# Patient Record
Sex: Female | Born: 1955 | Race: Black or African American | Hispanic: No | Marital: Married | Smoking: Never smoker
Health system: Southern US, Community
[De-identification: ages and names within clinical notes are randomized; demographics above are authoritative.]

## PROBLEM LIST (undated history)

## (undated) DIAGNOSIS — M1990 Unspecified osteoarthritis, unspecified site: Secondary | ICD-10-CM

## (undated) DIAGNOSIS — A159 Respiratory tuberculosis unspecified: Secondary | ICD-10-CM

## (undated) DIAGNOSIS — I10 Essential (primary) hypertension: Secondary | ICD-10-CM

## (undated) DIAGNOSIS — J189 Pneumonia, unspecified organism: Secondary | ICD-10-CM

## (undated) HISTORY — DX: Respiratory tuberculosis unspecified: A15.9

## (undated) HISTORY — DX: Unspecified osteoarthritis, unspecified site: M19.90

---
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
## 2006-03-01 HISTORY — PX: ABDOMINAL HYSTERECTOMY: SHX81

## 2011-07-09 ENCOUNTER — Ambulatory Visit
Admission: RE | Admit: 2011-07-09 | Discharge: 2011-07-09 | Disposition: A | Discharge status: Home / Self Care | Payer: Federal, State, Local not specified - PPO | Source: Ambulatory Visit | Attending: Family Medicine | Admitting: Family Medicine

## 2011-07-09 ENCOUNTER — Other Ambulatory Visit: Payer: Self-pay | Admitting: Family Medicine

## 2011-07-09 DIAGNOSIS — R05 Cough: Secondary | ICD-10-CM

## 2011-07-09 DIAGNOSIS — R6883 Chills (without fever): Secondary | ICD-10-CM

## 2011-08-28 ENCOUNTER — Encounter (HOSPITAL_COMMUNITY): Payer: Self-pay | Admitting: *Deleted

## 2011-08-28 ENCOUNTER — Emergency Department (HOSPITAL_COMMUNITY)
Admission: EM | Admit: 2011-08-28 | Discharge: 2011-08-28 | Discharge status: Against Medical Advice | Payer: Federal, State, Local not specified - PPO | Attending: Emergency Medicine | Admitting: Emergency Medicine

## 2011-08-28 DIAGNOSIS — R1110 Vomiting, unspecified: Secondary | ICD-10-CM | POA: Insufficient documentation

## 2011-08-28 DIAGNOSIS — Z0389 Encounter for observation for other suspected diseases and conditions ruled out: Secondary | ICD-10-CM | POA: Insufficient documentation

## 2011-08-28 HISTORY — DX: Essential (primary) hypertension: I10

## 2011-08-28 NOTE — ED Notes (Signed)
Here for feeling bad. Feel better after vomiting upon arrival. Reports "ate a really greasy steak earlier & felt indigestion and belching". Relates sx to acid reflux. "Then went to bed, woke up feeling bad". (denies: pain, sob, diarrhea, cough, congestion, cold sx, fever or dizziness), "feel better now".

## 2011-09-08 ENCOUNTER — Encounter (HOSPITAL_COMMUNITY): Payer: Self-pay | Admitting: *Deleted

## 2011-09-08 ENCOUNTER — Emergency Department (HOSPITAL_COMMUNITY)
Admission: EM | Admit: 2011-09-08 | Discharge: 2011-09-08 | Disposition: A | Discharge status: Home / Self Care | Payer: Federal, State, Local not specified - PPO | Attending: Emergency Medicine | Admitting: Emergency Medicine

## 2011-09-08 ENCOUNTER — Emergency Department (HOSPITAL_COMMUNITY): Payer: Federal, State, Local not specified - PPO

## 2011-09-08 DIAGNOSIS — I10 Essential (primary) hypertension: Secondary | ICD-10-CM | POA: Insufficient documentation

## 2011-09-08 DIAGNOSIS — Z79899 Other long term (current) drug therapy: Secondary | ICD-10-CM | POA: Insufficient documentation

## 2011-09-08 DIAGNOSIS — R609 Edema, unspecified: Secondary | ICD-10-CM | POA: Insufficient documentation

## 2011-09-08 DIAGNOSIS — B9789 Other viral agents as the cause of diseases classified elsewhere: Secondary | ICD-10-CM | POA: Insufficient documentation

## 2011-09-08 DIAGNOSIS — R059 Cough, unspecified: Secondary | ICD-10-CM | POA: Insufficient documentation

## 2011-09-08 DIAGNOSIS — R0602 Shortness of breath: Secondary | ICD-10-CM | POA: Insufficient documentation

## 2011-09-08 DIAGNOSIS — R05 Cough: Secondary | ICD-10-CM | POA: Insufficient documentation

## 2011-09-08 DIAGNOSIS — B349 Viral infection, unspecified: Secondary | ICD-10-CM

## 2011-09-08 HISTORY — DX: Pneumonia, unspecified organism: J18.9

## 2011-09-08 LAB — BASIC METABOLIC PANEL
Chloride: 100 mEq/L (ref 96–112)
GFR calc Af Amer: 85 mL/min — ABNORMAL LOW (ref >90)
Potassium: 3.3 mEq/L — ABNORMAL LOW (ref 3.5–5.1)

## 2011-09-08 LAB — CBC WITH DIFFERENTIAL/PLATELET
Basophils Absolute: 0.1 10*3/uL (ref 0.0–0.1)
Basophils Relative: 1 % (ref 0–1)
Hemoglobin: 13.4 g/dL (ref 12.0–15.0)
Lymphocytes Relative: 31 % (ref 12–46)
MCHC: 33.3 g/dL (ref 30.0–36.0)
Monocytes Relative: 10 % (ref 3–12)
Neutro Abs: 6.2 10*3/uL (ref 1.7–7.7)
Neutrophils Relative %: 58 % (ref 43–77)
RDW: 12.9 % (ref 11.5–15.5)
WBC: 10.7 10*3/uL — ABNORMAL HIGH (ref 4.0–10.5)

## 2011-09-08 LAB — URINE MICROSCOPIC-ADD ON

## 2011-09-08 LAB — URINALYSIS, ROUTINE W REFLEX MICROSCOPIC
Nitrite: NEGATIVE
Specific Gravity, Urine: 1.004 — ABNORMAL LOW (ref 1.005–1.030)
pH: 7 (ref 5.0–8.0)

## 2011-09-08 LAB — RAPID STREP SCREEN (MED CTR MEBANE ONLY): Streptococcus, Group A Screen (Direct): NEGATIVE

## 2011-09-08 MED ORDER — DEXAMETHASONE 6 MG PO TABS
12.0000 mg | ORAL_TABLET | Freq: Once | ORAL | Status: AC
  Administered 2011-09-08: 12 mg via ORAL
  Filled 2011-09-08: qty 2

## 2011-09-08 NOTE — ED Notes (Signed)
Pt reports having pneumonia one year ago, having the same symptoms again. Reports fevers, chills, productive cough and sob. No resp distress noted at triage, spo2 100%

## 2011-09-08 NOTE — ED Provider Notes (Signed)
History     CSN: [id number]  Arrival date & time 09/08/11  1345   First MD Initiated Contact with Patient 09/08/11 2052      Chief Complaint  Patient presents with  . Shortness of Breath  . Cough    (Consider location/radiation/quality/duration/timing/severity/associated sxs/prior treatment) Patient is a 56 y.o. female presenting with shortness of breath and cough. The history is provided by the patient.  Shortness of Breath  Associated symptoms include cough and shortness of breath.  Cough Associated symptoms include shortness of breath.  She has been having some soreness in the lateral chest bilaterally for about the last week. She has had some subjective low-grade fever and some chills but no sweats. She coughs in the morning and raises a small amount of sputum and has no further coughing today and she has no nocturnal cough. She denies any dyspnea, nausea, vomiting. Similar symptoms one year ago were due to pneumonia so she came into the emergency department today evaluated for same. She has not done anything for treatment at home other than over-the-counter cough medication. She has had a mild sore throat and some slight dysuria. She saw her PCP who noted a cerumen impaction which was relieved. She had some dizziness prior to that but feels better regarding the dizziness following the removal of the cerumen impaction.  Past Medical History  Diagnosis Date  . Hypertension   . Pneumonia     Past Surgical History  Procedure Date  . Abdominal hysterectomy   . Knee surgery     Right    History reviewed. No pertinent family history.  History  Substance Use Topics  . Smoking status: Never Smoker   . Smokeless tobacco: Not on file  . Alcohol Use: No    OB History    Grav Para Term Preterm Abortions TAB SAB Ect Mult Living                  Review of Systems  Respiratory: Positive for cough and shortness of breath.   All other systems reviewed and are  negative.    Allergies  Aspirin and Penicillins  Home Medications   Current Outpatient Rx  Name Route Sig Dispense Refill  . AZELASTINE HCL 137 MCG/SPRAY NA SOLN Nasal Place 1 spray into the nose 2 (two) times daily. Use in each nostril as directed    . CETIRIZINE HCL 10 MG PO TABS Oral Take 10 mg by mouth daily.    Marland Kitchen VITAMIN D 1000 UNITS PO TABS Oral Take 1,000 Units by mouth 4 (four) times daily.    Marland Kitchen HYDROCHLOROTHIAZIDE 12.5 MG PO CAPS Oral Take 12.5 mg by mouth daily.    Marland Kitchen OLMESARTAN MEDOXOMIL 20 MG PO TABS Oral Take by mouth daily.     . OMEGA-3-ACID ETHYL ESTERS 1 G PO CAPS Oral Take 2 g by mouth 2 (two) times daily.    Marland Kitchen RANITIDINE HCL 150 MG PO TABS Oral Take 150 mg by mouth 2 (two) times daily.      BP 141/81  Pulse 55  Temp 98.2 F (36.8 C) (Oral)  Resp 18  SpO2 100%  Physical Exam  Nursing note and vitals reviewed.  56 year old female is resting currently in no acute distress. Vital signs are significant for mild hypertension with blood pressure 150 or 70. Oxygen saturation is 100% which is normal. Head is normocephalic and atraumatic. PERRLA, EOMI. Oropharynx is mild tonsillar erythema and hypertrophy without exudate. She's had difficulty with secretions and phonation  is normal. Neck is nontender and supple without adenopathy, JVD, or stridor. Lungs are clear without rales, wheezes, rhonchi. Heart has regular rate rhythm without murmur. There is no chest wall tenderness. Abdomen is soft, flat, nontender without masses or hepatosplenomegaly. Extremities have 2+ edema, no cyanosis. Full range of motion is present. Skin is warm and dry without rash. Neurologic: Mental status is normal, cranial nerves are intact, there are no motor or sensory deficits.  ED Course  Procedures (including critical care time)  Results for orders placed during the hospital encounter of 09/08/11  CBC WITH DIFFERENTIAL      Component Value Range   WBC 10.7 (*) 4.0 - 10.5 K/uL   RBC 4.44  3.87 -  5.11 MIL/uL   Hemoglobin 13.4  12.0 - 15.0 g/dL   HCT 16.1  09.6 - 04.5 %   MCV 90.5  78.0 - 100.0 fL   MCH 30.2  26.0 - 34.0 pg   MCHC 33.3  30.0 - 36.0 g/dL   RDW 40.9  81.1 - 91.4 %   Platelets 292  150 - 400 K/uL   Neutrophils Relative 58  43 - 77 %   Neutro Abs 6.2  1.7 - 7.7 K/uL   Lymphocytes Relative 31  12 - 46 %   Lymphs Abs 3.3  0.7 - 4.0 K/uL   Monocytes Relative 10  3 - 12 %   Monocytes Absolute 1.1 (*) 0.1 - 1.0 K/uL   Eosinophils Relative 1  0 - 5 %   Eosinophils Absolute 0.1  0.0 - 0.7 K/uL   Basophils Relative 1  0 - 1 %   Basophils Absolute 0.1  0.0 - 0.1 K/uL  BASIC METABOLIC PANEL      Component Value Range   Sodium 141  135 - 145 mEq/L   Potassium 3.3 (*) 3.5 - 5.1 mEq/L   Chloride 100  96 - 112 mEq/L   CO2 26  19 - 32 mEq/L   Glucose, Bld 88  70 - 99 mg/dL   BUN 10  6 - 23 mg/dL   Creatinine, Ser 7.82  0.50 - 1.10 mg/dL   Calcium 95.6  8.4 - 21.3 mg/dL   GFR calc non Af Amer 73 (*) >90 mL/min   GFR calc Af Amer 85 (*) >90 mL/min  URINALYSIS, ROUTINE W REFLEX MICROSCOPIC      Component Value Range   Color, Urine YELLOW  YELLOW   APPearance CLEAR  CLEAR   Specific Gravity, Urine 1.004 (*) 1.005 - 1.030   pH 7.0  5.0 - 8.0   Glucose, UA NEGATIVE  NEGATIVE mg/dL   Hgb urine dipstick NEGATIVE  NEGATIVE   Bilirubin Urine NEGATIVE  NEGATIVE   Ketones, ur NEGATIVE  NEGATIVE mg/dL   Protein, ur NEGATIVE  NEGATIVE mg/dL   Urobilinogen, UA 0.2  0.0 - 1.0 mg/dL   Nitrite NEGATIVE  NEGATIVE   Leukocytes, UA TRACE (*) NEGATIVE  URINE MICROSCOPIC-ADD ON      Component Value Range   Squamous Epithelial / LPF MANY (*) RARE   WBC, UA 0-2  <3 WBC/hpf   RBC / HPF 0-2  <3 RBC/hpf   Dg Chest 2 View  09/08/2011  *RADIOLOGY REPORT*  Clinical Data: Cough and shortness of breath.  CHEST - 2 VIEW  Comparison: PA and lateral chest 07/09/2011.  Findings: Small calcification projecting over the right mid lung on the PA view is again seen.  Lungs otherwise clear.  No  pneumothorax or effusion.  Heart size normal.  IMPRESSION: No acute disease.  Original Report Authenticated By: Bernadene Bell. D'ALESSIO, M.D.      1. Viral syndrome       MDM  Vague illness with some symptoms suggestive of pharyngitis, some symptoms suggestive of bronchial infection and, and some symptoms suggestive of urinary tract infection. Chest x-ray shows no pneumonia and, WBC is slightly elevated, urinalysis is unremarkable. Beta strep screen will be obtained and she'll be given a single dose of dexamethasone to treat possible viral pharyngitis. She does relate that a family member was tested for strep but actually had impetigo.  Workup has come back completely negative. Urinalysis is normal, rapid strep screen is negative, chest x-ray is negative. She appears to have a viral illness. Because of pharyngitis, and she'll be given a single dose of dexamethasone. She is to followup with her PCP as needed. She's given reassurance regarding no evidence of pneumonia.      Dione Booze, MD 09/08/11 2207

## 2011-09-08 NOTE — ED Notes (Addendum)
Med ordered from pharmacy and pharmacy called.

## 2011-11-24 ENCOUNTER — Other Ambulatory Visit: Payer: Self-pay | Admitting: Internal Medicine

## 2011-11-24 DIAGNOSIS — Z1231 Encounter for screening mammogram for malignant neoplasm of breast: Secondary | ICD-10-CM

## 2011-12-13 ENCOUNTER — Ambulatory Visit
Admission: RE | Admit: 2011-12-13 | Discharge: 2011-12-13 | Disposition: A | Discharge status: Home / Self Care | Payer: Federal, State, Local not specified - PPO | Source: Ambulatory Visit | Attending: Internal Medicine | Admitting: Internal Medicine

## 2011-12-13 DIAGNOSIS — Z1231 Encounter for screening mammogram for malignant neoplasm of breast: Secondary | ICD-10-CM

## 2011-12-14 ENCOUNTER — Ambulatory Visit (INDEPENDENT_AMBULATORY_CARE_PROVIDER_SITE_OTHER): Payer: Federal, State, Local not specified - PPO | Admitting: Obstetrics and Gynecology

## 2011-12-14 ENCOUNTER — Encounter: Payer: Self-pay | Admitting: Obstetrics and Gynecology

## 2011-12-14 VITALS — BP 122/80 | Ht 64.6 in | Wt 262.0 lb

## 2011-12-14 DIAGNOSIS — I10 Essential (primary) hypertension: Secondary | ICD-10-CM | POA: Insufficient documentation

## 2011-12-14 DIAGNOSIS — Z01419 Encounter for gynecological examination (general) (routine) without abnormal findings: Secondary | ICD-10-CM

## 2011-12-14 NOTE — Progress Notes (Signed)
The patient is not taking hormone replacement therapy The patient  is taking a Calcium supplement. Post-menopausal bleeding:no  Last Pap: was normal 2011 Last mammogram: pt had app yesterday  October  2013 Last DEXA scan : pt stated could not finish the test.  Last colonoscopy:normal 2008 every 10 years   Urinary symptoms: none Normal bowel movements: Yes Reports abuse at home: No:  Pt stated no issues today / NGYN  Subjective:    Jasmine Harrington is a 56 y.o. female G2P1011 who presents for annual exam. S/P TVH with BSO?  2008 for fibroids/endometriosis. Will request op report The patient has no complaints today.   The following portions of the patient's history were reviewed and updated as appropriate: allergies, current medications, past family history, past medical history, past social history, past surgical history and problem list.  Review of Systems Pertinent items are noted in HPI. Gastrointestinal:No change in bowel habits, no abdominal pain, no rectal bleeding Genitourinary:negative for dysuria, frequency, hematuria, nocturia and urinary incontinence    Objective:     BP 122/80  Ht 5' 4.6" (1.641 m)  Wt 262 lb (118.842 kg)  BMI 44.14 kg/m2  Weight:  Wt Readings from Last 1 Encounters:  12/14/11 262 lb (118.842 kg)     BMI: Body mass index is 44.14 kg/(m^2). General Appearance: Alert, appropriate appearance for age. No acute distress HEENT: Grossly normal Neck / Thyroid: Supple, no masses, nodes or enlargement Lungs: clear to auscultation bilaterally Back: No CVA tenderness Breast Exam: No masses or nodes.No dimpling, nipple retraction or discharge. Cardiovascular: Regular rate and rhythm. S1, S2, no murmur Gastrointestinal: Soft, non-tender, no masses or organomegaly Pelvic Exam: normal with no uterus or cervix Rectovaginal: normal rectal, no masses Lymphatic Exam: Non-palpable nodes in neck, clavicular, axillary, or inguinal regions Skin: no rash or  abnormalities Neurologic: Normal gait and speech, no tremor  Psychiatric: Alert and oriented, appropriate affect.    Urinalysis:Not done    Assessment:    Normal gyn exam    Plan:   mammogram pap smear return annually or prn Follow-up:  for annual exam   Silverio Lay MD

## 2012-03-26 ENCOUNTER — Emergency Department (HOSPITAL_COMMUNITY)
Admission: EM | Admit: 2012-03-26 | Discharge: 2012-03-26 | Disposition: A | Discharge status: Home / Self Care | Payer: Federal, State, Local not specified - PPO | Source: Home / Self Care | Attending: Family Medicine | Admitting: Family Medicine

## 2012-03-26 ENCOUNTER — Encounter (HOSPITAL_COMMUNITY): Payer: Self-pay | Admitting: *Deleted

## 2012-03-26 DIAGNOSIS — J069 Acute upper respiratory infection, unspecified: Secondary | ICD-10-CM

## 2012-03-26 MED ORDER — IPRATROPIUM BROMIDE 0.06 % NA SOLN: 2.0000 | Freq: Four times a day (QID) | NASAL | Status: DC

## 2012-03-26 NOTE — ED Provider Notes (Signed)
History     CSN: [id number]  Arrival date & time 03/26/12  1314   First MD Initiated Contact with Patient 03/26/12 1329      Chief Complaint  Patient presents with  . URI    (Consider location/radiation/quality/duration/timing/severity/associated sxs/prior treatment) Patient is a 57 y.o. female presenting with URI. The history is provided by the patient.  URI The primary symptoms include cough. Primary symptoms do not include fever, sore throat, swollen glands, wheezing or abdominal pain. The current episode started 2 days ago. This is a new problem. The problem has not changed since onset. The onset of the illness is associated with exposure to sick contacts. Symptoms associated with the illness include congestion and rhinorrhea. The illness is not associated with chills or sinus pressure.    Past Medical History  Diagnosis Date  . Hypertension   . Pneumonia   . Arthritis   . Tuberculosis     pt was pos for tb . had to take 6 months of meds .     Past Surgical History  Procedure Date  . Abdominal hysterectomy   . Knee surgery     Right    Family History  Problem Relation Age of Onset  . Hypertension Paternal Grandfather   . Hypertension Paternal Grandmother   . Hypertension Maternal Grandmother   . Hypertension Maternal Grandfather   . Prostate cancer Father   . Breast cancer Mother   . Leukemia Brother     History  Substance Use Topics  . Smoking status: Never Smoker   . Smokeless tobacco: Never Used  . Alcohol Use: No    OB History    Grav Para Term Preterm Abortions TAB SAB Ect Mult Living   2 1 1  1  1   1       Review of Systems  Constitutional: Negative for fever and chills.  HENT: Positive for congestion and rhinorrhea. Negative for sore throat and sinus pressure.   Respiratory: Positive for cough. Negative for shortness of breath and wheezing.   Gastrointestinal: Negative.  Negative for abdominal pain.    Allergies  Aspirin and  Penicillins  Home Medications   Current Outpatient Rx  Name  Route  Sig  Dispense  Refill  . AZELASTINE HCL 137 MCG/SPRAY NA SOLN   Nasal   Place 1 spray into the nose 2 (two) times daily. Use in each nostril as directed         . CETIRIZINE HCL 10 MG PO TABS   Oral   Take 10 mg by mouth daily.         Marland Kitchen VITAMIN D 1000 UNITS PO TABS   Oral   Take 1,000 Units by mouth 4 (four) times daily.         Marland Kitchen HYDROCHLOROTHIAZIDE 12.5 MG PO CAPS   Oral   Take 12.5 mg by mouth daily.         . IPRATROPIUM BROMIDE 0.06 % NA SOLN   Nasal   Place 2 sprays into the nose 4 (four) times daily.   15 mL   1   . LORATADINE 10 MG PO TABS   Oral   Take 10 mg by mouth daily.         Marland Kitchen OLMESARTAN MEDOXOMIL 20 MG PO TABS   Oral   Take by mouth daily.          . OMEGA-3-ACID ETHYL ESTERS 1 G PO CAPS   Oral   Take 2 g by  mouth 2 (two) times daily.         Marland Kitchen RANITIDINE HCL 150 MG PO TABS   Oral   Take 150 mg by mouth 2 (two) times daily.           BP 140/76  Pulse 63  Temp 98.2 F (36.8 C) (Oral)  Resp 17  SpO2 100%  Physical Exam  Nursing note and vitals reviewed. Constitutional: She is oriented to person, place, and time. She appears well-developed and well-nourished.  HENT:  Head: Normocephalic.  Right Ear: External ear normal.  Left Ear: External ear normal.  Mouth/Throat: Oropharynx is clear and moist.  Eyes: Pupils are equal, round, and reactive to light.  Neck: Normal range of motion. Neck supple.  Cardiovascular: Normal rate, regular rhythm, normal heart sounds and intact distal pulses.   Pulmonary/Chest: Effort normal and breath sounds normal.  Lymphadenopathy:    She has no cervical adenopathy.  Neurological: She is alert and oriented to person, place, and time.  Skin: Skin is warm and dry.    ED Course  Procedures (including critical care time)  Labs Reviewed - No data to display No results found.   1. URI (upper respiratory infection)        MDM          Linna Hoff, MD 03/27/12 [phone]

## 2012-03-26 NOTE — ED Notes (Signed)
Patient complains of sinus congestion, drainage, and cough x 3 days; yellow phlegm. Denies nausea, vomiting, diarrhea.

## 2012-11-15 ENCOUNTER — Other Ambulatory Visit: Payer: Self-pay

## 2012-11-15 DIAGNOSIS — Z1231 Encounter for screening mammogram for malignant neoplasm of breast: Secondary | ICD-10-CM

## 2012-12-19 ENCOUNTER — Ambulatory Visit
Admission: RE | Admit: 2012-12-19 | Discharge: 2012-12-19 | Disposition: A | Discharge status: Home / Self Care | Payer: Federal, State, Local not specified - PPO | Source: Ambulatory Visit

## 2012-12-19 DIAGNOSIS — Z1231 Encounter for screening mammogram for malignant neoplasm of breast: Secondary | ICD-10-CM

## 2012-12-25 ENCOUNTER — Other Ambulatory Visit: Payer: Self-pay | Admitting: Internal Medicine

## 2012-12-25 DIAGNOSIS — R928 Other abnormal and inconclusive findings on diagnostic imaging of breast: Secondary | ICD-10-CM

## 2013-01-11 ENCOUNTER — Ambulatory Visit
Admission: RE | Admit: 2013-01-11 | Discharge: 2013-01-11 | Disposition: A | Discharge status: Home / Self Care | Source: Ambulatory Visit | Attending: Internal Medicine | Admitting: Internal Medicine

## 2013-01-11 ENCOUNTER — Ambulatory Visit
Admission: RE | Admit: 2013-01-11 | Discharge: 2013-01-11 | Disposition: A | Discharge status: Home / Self Care | Payer: Federal, State, Local not specified - PPO | Source: Ambulatory Visit | Attending: Internal Medicine | Admitting: Internal Medicine

## 2013-01-11 DIAGNOSIS — R928 Other abnormal and inconclusive findings on diagnostic imaging of breast: Secondary | ICD-10-CM

## 2013-06-14 IMAGING — CR DG CHEST 2V
2 series · 2 of 2 positions shown · non-contrast
Comparison: PA and lateral chest 07/09/2011.

CLINICAL DATA: Cough and shortness of breath.

CHEST - 2 VIEW

[w chest pa]
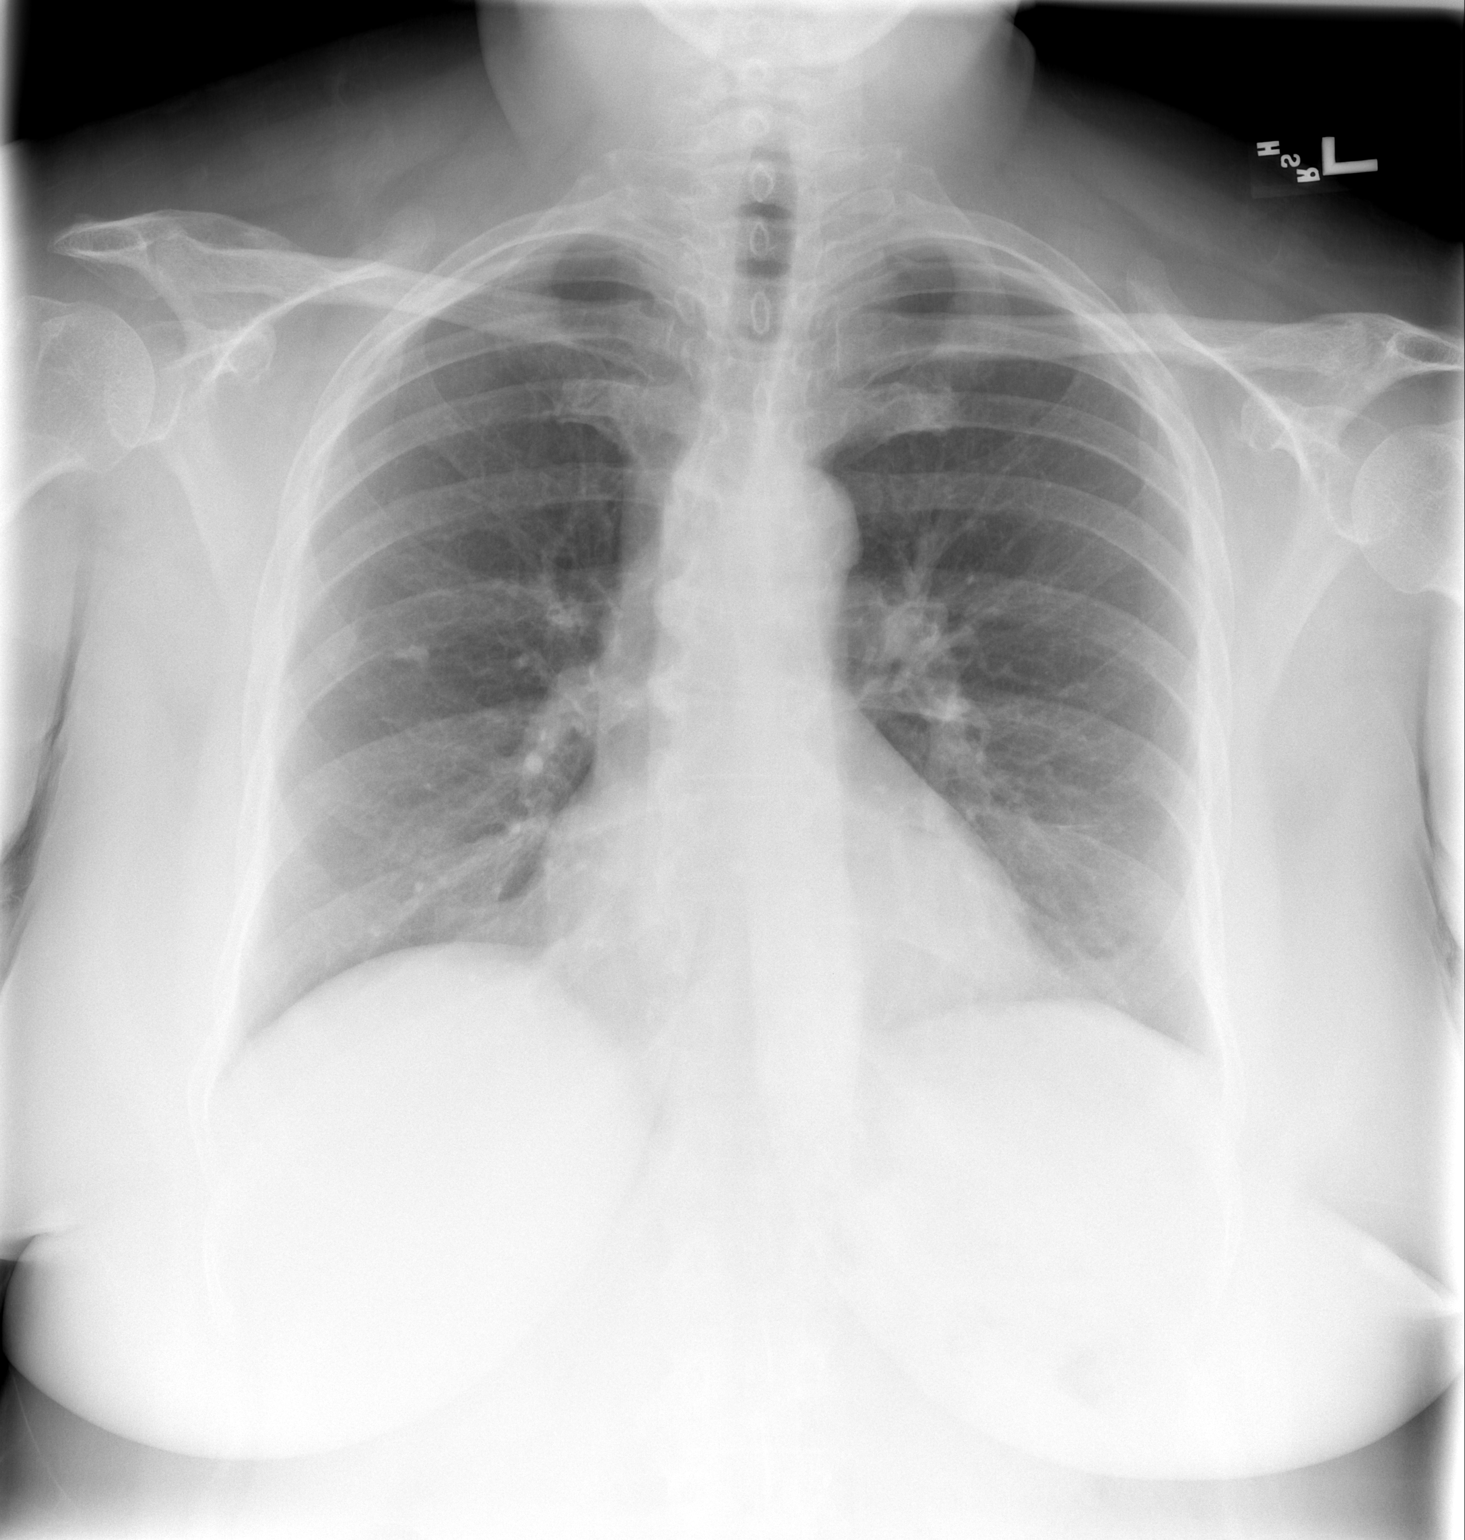

[w chest lat]
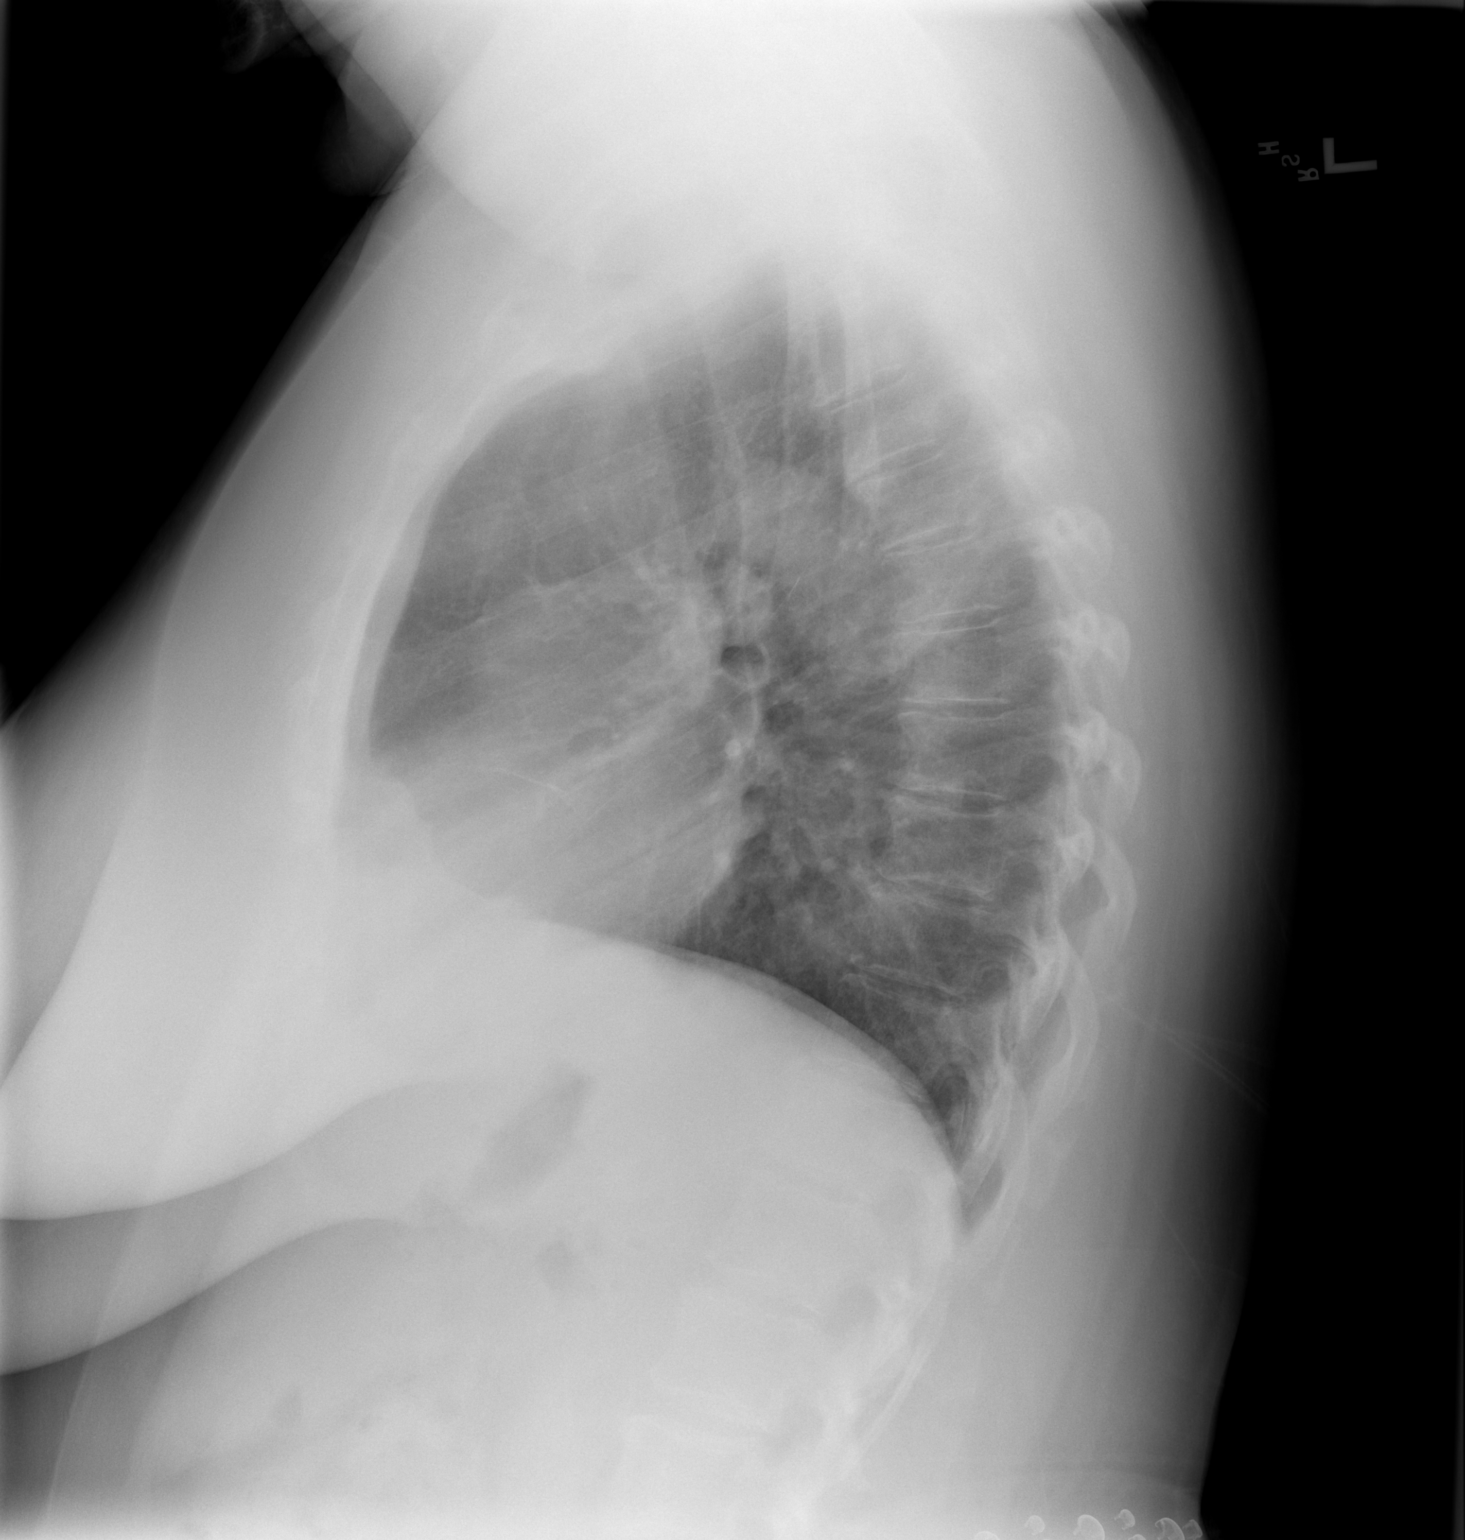

[2 of 2 positions shown; findings below may reference images not displayed]

FINDINGS: Small calcification projecting over the right mid lung on
the PA view is again seen.  Lungs otherwise clear.  No pneumothorax
or effusion.  Heart size normal.
IMPRESSION: No acute disease.

## 2013-06-24 ENCOUNTER — Emergency Department (INDEPENDENT_AMBULATORY_CARE_PROVIDER_SITE_OTHER)
Admission: EM | Admit: 2013-06-24 | Discharge: 2013-06-24 | Disposition: A | Discharge status: Home / Self Care | Payer: Federal, State, Local not specified - PPO | Source: Home / Self Care | Attending: Family Medicine | Admitting: Family Medicine

## 2013-06-24 ENCOUNTER — Emergency Department (INDEPENDENT_AMBULATORY_CARE_PROVIDER_SITE_OTHER): Payer: Federal, State, Local not specified - PPO

## 2013-06-24 ENCOUNTER — Encounter (HOSPITAL_COMMUNITY): Payer: Self-pay | Admitting: Emergency Medicine

## 2013-06-24 DIAGNOSIS — M1710 Unilateral primary osteoarthritis, unspecified knee: Secondary | ICD-10-CM

## 2013-06-24 DIAGNOSIS — IMO0002 Reserved for concepts with insufficient information to code with codable children: Secondary | ICD-10-CM

## 2013-06-24 DIAGNOSIS — M1711 Unilateral primary osteoarthritis, right knee: Secondary | ICD-10-CM

## 2013-06-24 NOTE — ED Provider Notes (Signed)
[name] is a 58 y.o. female who presents to Urgent Care today for right knee pain. Patient slipped and fell onto her right knee 5 days ago. She continues to experience right knee pain. The pain is worse with activity and better with rest. She's tried Tylenol rest and elevation which helps some. She feels well otherwise. She has a history of DJD of the right knee.   Past Medical History  Diagnosis Date  . Hypertension   . Pneumonia   . Arthritis   . Tuberculosis     pt was pos for tb . had to take 6 months of meds .    History  Substance Use Topics  . Smoking status: Never Smoker   . Smokeless tobacco: Never Used  . Alcohol Use: No   ROS as above Medications: No current facility-administered medications for this encounter.   Current Outpatient Prescriptions  Medication Sig Dispense Refill  . azelastine (ASTELIN) 137 MCG/SPRAY nasal spray Place 1 spray into the nose 2 (two) times daily. Use in each nostril as directed      . cetirizine (ZYRTEC) 10 MG tablet Take 10 mg by mouth daily.      . cholecalciferol (VITAMIN D) 1000 UNITS tablet Take 1,000 Units by mouth 4 (four) times daily.      . hydrochlorothiazide (MICROZIDE) 12.5 MG capsule Take 12.5 mg by mouth daily.      Marland Kitchen. ipratropium (ATROVENT) 0.06 % nasal spray Place 2 sprays into the nose 4 (four) times daily.  15 mL  1  . loratadine (CLARITIN) 10 MG tablet Take 10 mg by mouth daily.      Marland Kitchen. olmesartan (BENICAR) 20 MG tablet Take by mouth daily.       Marland Kitchen. omega-3 acid ethyl esters (LOVAZA) 1 G capsule Take 2 g by mouth 2 (two) times daily.      . ranitidine (ZANTAC) 150 MG tablet Take 150 mg by mouth 2 (two) times daily.        Exam:  BP 183/83  Pulse 58  Temp(Src) 98.1 F (36.7 C) (Oral)  Resp 18  SpO2 100% Gen: Well NAD Right knee: Mild effusion. Diffusely tender. Range of motion limited to 10 of extension secondary to pain. Stable ligamentous exam. Capillary refill sensation pulses are intact distally.  No results  found for this or any previous visit (from the past 24 hour(s)). Dg Knee Complete 4 Views Right  06/24/2013   CLINICAL DATA:  Right knee pain following fall.  Remote knee surgery  EXAM: RIGHT KNEE - COMPLETE 4+ VIEW  COMPARISON:  None.  FINDINGS: There is narrowing of the medial compartment with associated osteophytosis. Osteophytosis the lateral compartment additionally. There is no evidence of acute fracture or dislocation. There is a small suprapatellar joint effusion. Small cerclage wire anterior to the tibia.  IMPRESSION: 1. No evidence of fracture. 2. Small effusion. 3. 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   Electronically Signed   By: Jasmine Harrington  Harrington M.D.   On: 06/24/2013 13:13    Assessment and Plan: 58 y.o. female with knee DJD exacerbation due to fall. No evidence of fracture. Patient wishes to continue Tylenol and watchful waiting. She declines further medications. Followup with primary care provider.  Discussed warning signs or symptoms. Please see discharge instructions. Patient expresses understanding.    Jasmine BongEvan S Evangela Heffler, MD 06/24/13 [phone]

## 2013-06-24 NOTE — ED Notes (Signed)
Patient states fell Tuesday while picking up some stuff Injured her right knee Has a pin in the same  Knee since the age of 74

## 2013-06-24 NOTE — Discharge Instructions (Signed)
Thank you for coming in today. Continue Tylenol rest ice and elevation Come back as needed . Knee Pain Knee pain can be a result of an injury or other medical conditions. Treatment will depend on the cause of your pain. HOME CARE  Only take medicine as told by your doctor.  Keep a healthy weight. Being overweight can make the knee hurt more.  Stretch before exercising or playing sports.  If there is constant knee pain, change the way you exercise. Ask your doctor for advice.  Make sure shoes fit well. Choose the right shoe for the sport or activity.  Protect your knees. Wear kneepads if needed.  Rest when you are tired. GET HELP RIGHT AWAY IF:   Your knee pain does not stop.  Your knee pain does not get better.  Your knee joint feels hot to the touch.  You have a fever. MAKE SURE YOU:   Understand these instructions.  Will watch this condition.  Will get help right away if you are not doing well or get worse. Document Released: 05/14/2008 Document Revised: 05/10/2011 Document Reviewed: 05/14/2008 [facility] Patient Information 2014 [place], [place].  Wear and Tear Disorders of the Knee (Arthritis, Osteoarthritis) Everyone will experience wear and tear injuries (arthritis, osteoarthritis) of the knee. These are the changes we all get as we age. They come from the joint stress of daily living. The amount of cartilage damage in your knee and your symptoms determine if you need surgery. Mild problems require approximately two months recovery time. More severe problems take several months to recover. With mild problems, your surgeon may find worn and rough cartilage surfaces. With severe changes, your surgeon may find cartilage that has completely worn away and exposed the bone. Loose bodies of bone and cartilage, bone spurs (excess bone growth), and injuries to the menisci (cushions between the large bones of your leg) are also common. All of these problems can cause pain. For a  mild wear and tear problem, rough cartilage may simply need to be shaved and smoothed. For more severe problems with areas of exposed bone, your surgeon may use an instrument for roughing up the bone surfaces to stimulate new cartilage growth. Loose bodies are usually removed. Torn menisci may be trimmed or repaired. ABOUT THE ARTHROSCOPIC PROCEDURE Arthroscopy is a surgical technique. It allows your orthopedic surgeon to diagnose and treat your knee injury with accuracy. The surgeon looks into your knee through a small scope. The scope is like a small (pencil-sized) telescope. Arthroscopy is less invasive than open knee surgery. You can expect a more rapid recovery. After the procedure, you will be moved to a recovery area until most of the effects of the medication have worn off. Your caregiver will discuss the test results with you. RECOVERY The severity of the arthritis and the type of procedure performed will determine recovery time. Other important factors include age, physical condition, medical conditions, and the type of rehabilitation program. Strengthening your muscles after arthroscopy helps guarantee a better recovery. Follow your caregiver's instructions. Use crutches, rest, elevate, ice, and do knee exercises as instructed. Your caregivers will help you and instruct you with exercises and other physical therapy required to regain your mobility, muscle strength, and functioning following surgery. Only take over-the-counter or prescription medicines for pain, discomfort, or fever as directed by your caregiver.  SEEK MEDICAL CARE IF:   There is increased bleeding (more than a small spot) from the wound.  You notice redness, swelling, or increasing pain in  the wound.  Pus is coming from wound.  You develop an unexplained oral temperature above 102 F (38.9 C) , or as your caregiver suggests.  You notice a foul smell coming from the wound or dressing.  You have severe pain with motion of  the knee. SEEK IMMEDIATE MEDICAL CARE IF:   You develop a rash.  You have difficulty breathing.  You have any allergic problems. MAKE SURE YOU:   Understand these instructions.  Will watch your condition.  Will get help right away if you are not doing well or get worse. Document Released: 02/13/2000 Document Revised: 05/10/2011 Document Reviewed: 07/12/2007 [facility] Patient Information 2014 [place], [place].

## 2013-12-31 ENCOUNTER — Encounter (HOSPITAL_COMMUNITY): Payer: Self-pay | Admitting: Emergency Medicine

## 2014-02-08 ENCOUNTER — Other Ambulatory Visit: Payer: Self-pay

## 2014-02-08 DIAGNOSIS — Z1231 Encounter for screening mammogram for malignant neoplasm of breast: Secondary | ICD-10-CM

## 2014-03-07 ENCOUNTER — Ambulatory Visit
Admission: RE | Admit: 2014-03-07 | Discharge: 2014-03-07 | Disposition: A | Discharge status: Home / Self Care | Payer: Federal, State, Local not specified - PPO | Source: Ambulatory Visit

## 2014-03-07 DIAGNOSIS — Z1231 Encounter for screening mammogram for malignant neoplasm of breast: Secondary | ICD-10-CM

## 2015-03-31 IMAGING — CR DG KNEE COMPLETE 4+V*R*
4 series · 4 of 4 positions shown · non-contrast
Comparison: None.

CLINICAL DATA: Right knee pain following fall.  Remote knee surgery

EXAM:
RIGHT KNEE - COMPLETE 4+ VIEW

[view not recorded (1 of 4)]
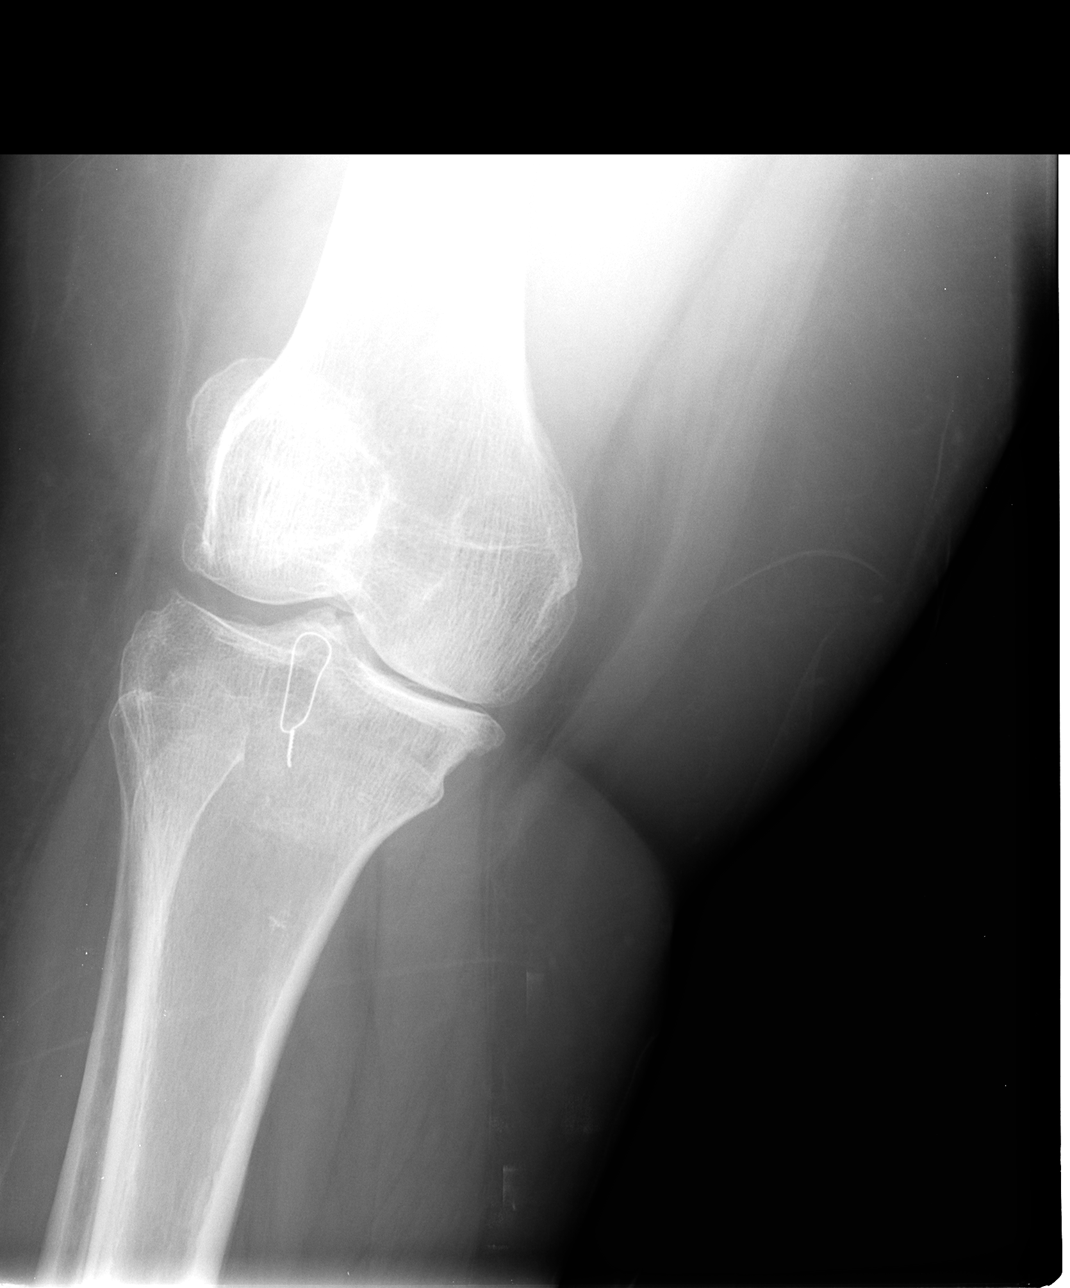

[view not recorded (2 of 4)]
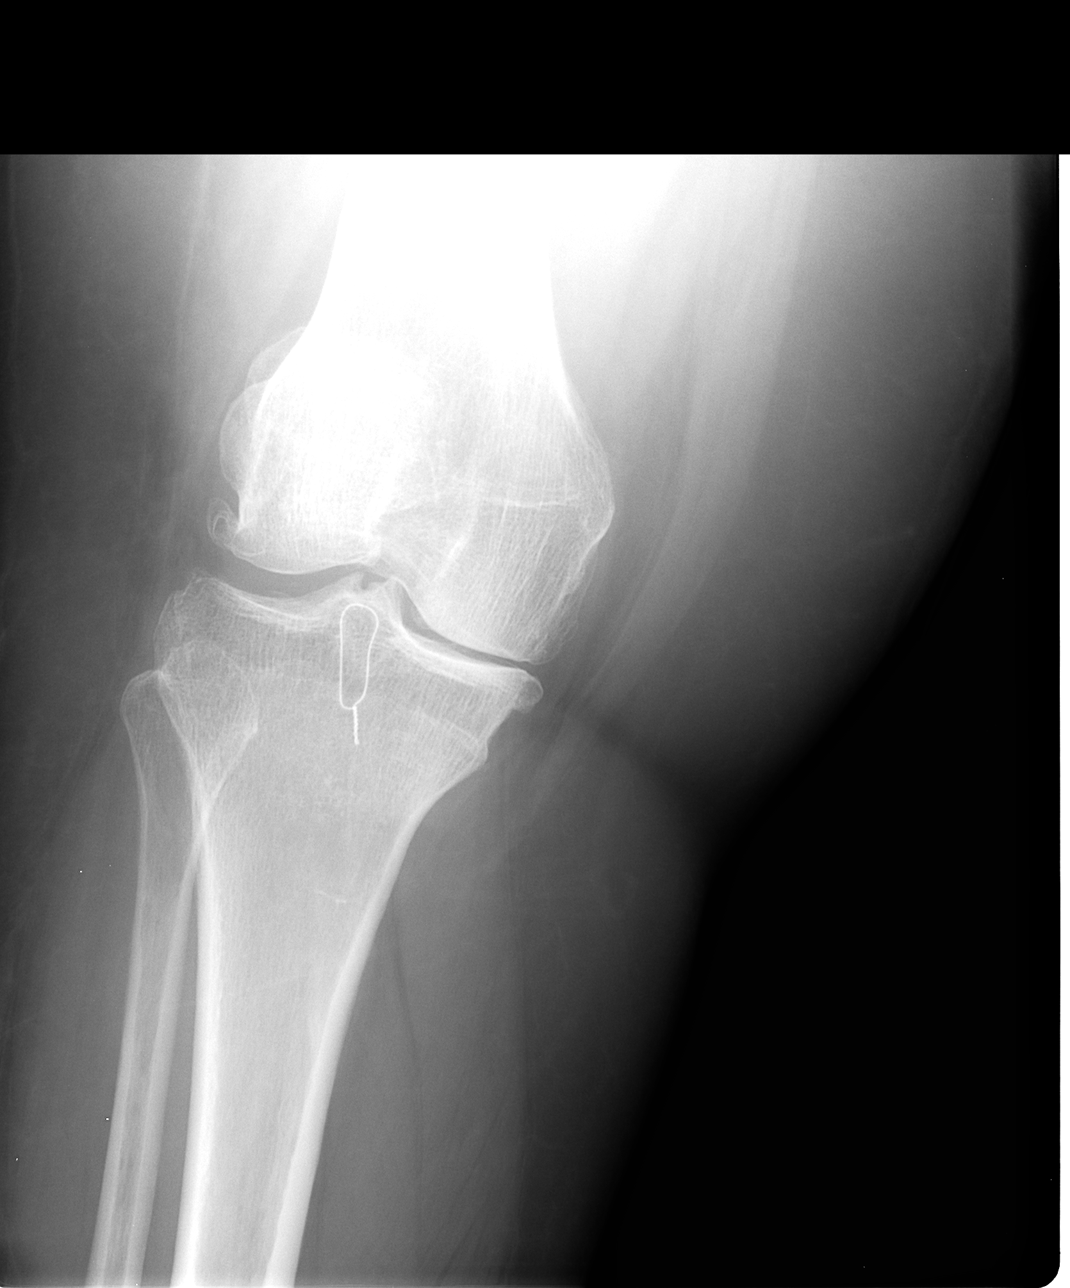

[view not recorded (3 of 4)]
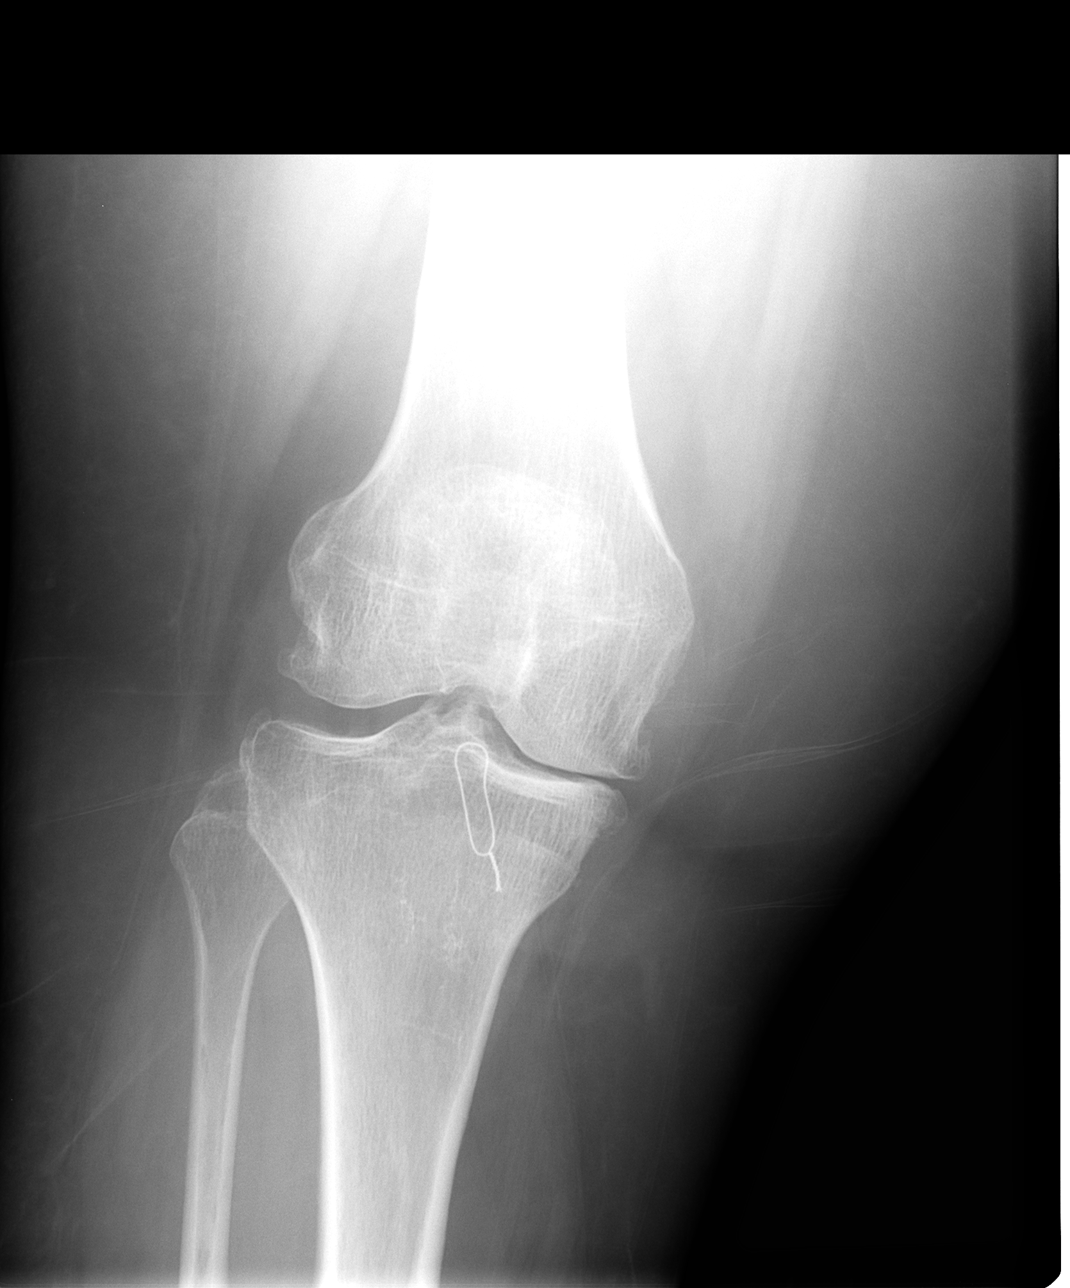

[view not recorded (4 of 4)]
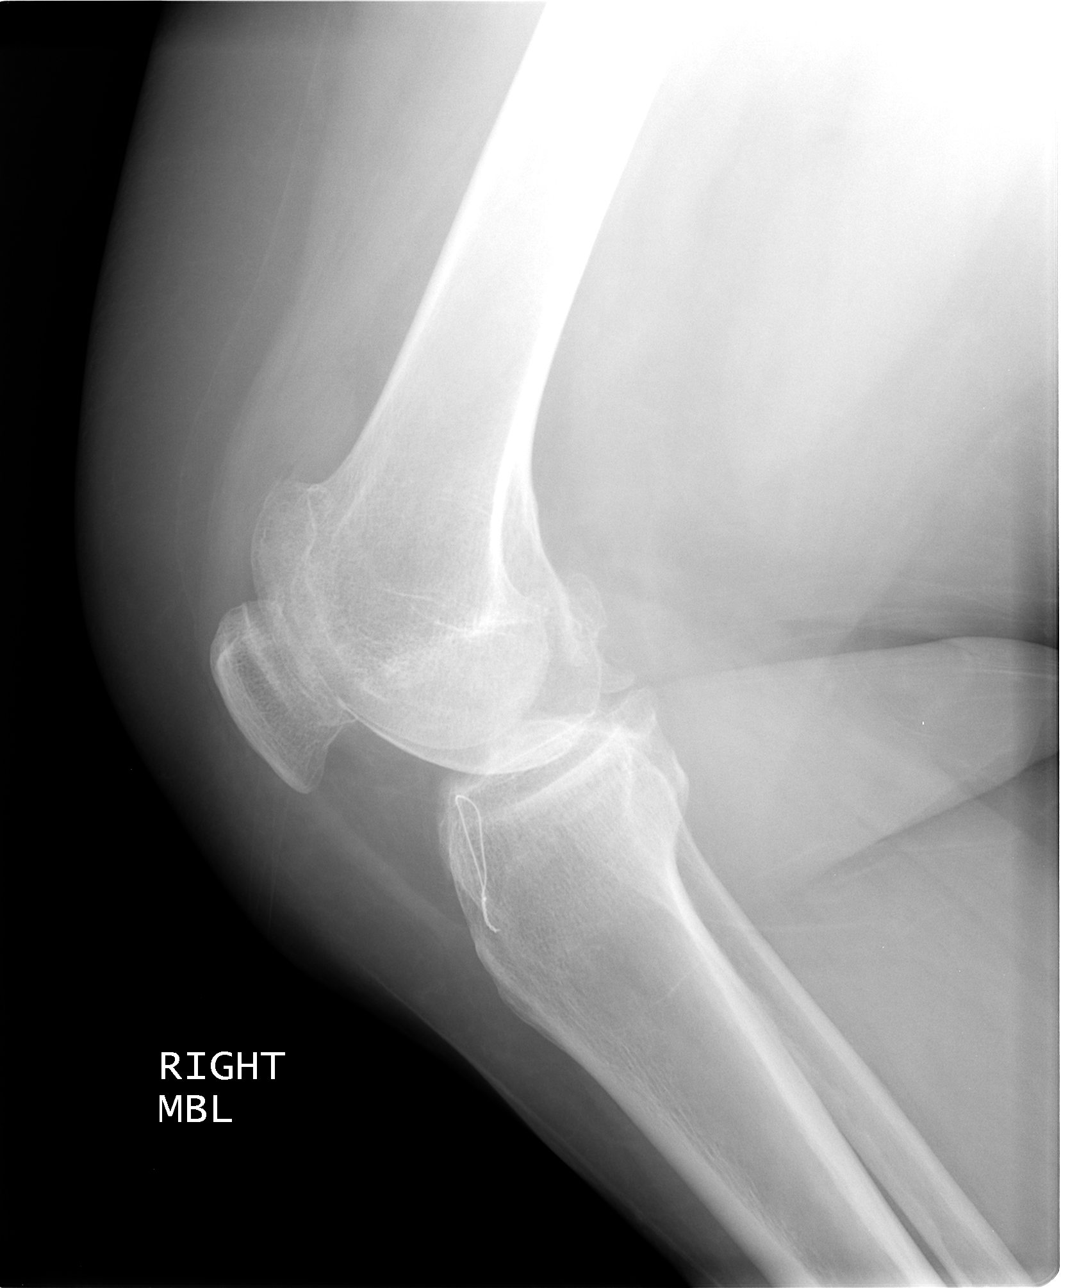

[4 of 4 positions shown; findings below may reference images not displayed]

FINDINGS: There is narrowing of the medial compartment with associated
osteophytosis. Osteophytosis the lateral compartment additionally.
There is no evidence of acute fracture or dislocation. There is a
small suprapatellar joint effusion. Small cerclage wire anterior to
the tibia.
IMPRESSION: 1. No evidence of fracture.
2. Small effusion.
3. Bicompartment osteoarthritis more severe in the medial
compartment.

## 2015-04-21 ENCOUNTER — Other Ambulatory Visit: Payer: Self-pay

## 2015-04-21 DIAGNOSIS — Z1231 Encounter for screening mammogram for malignant neoplasm of breast: Secondary | ICD-10-CM

## 2015-05-02 ENCOUNTER — Ambulatory Visit
Admission: RE | Admit: 2015-05-02 | Discharge: 2015-05-02 | Disposition: A | Discharge status: Home / Self Care | Source: Ambulatory Visit

## 2015-05-02 DIAGNOSIS — Z1231 Encounter for screening mammogram for malignant neoplasm of breast: Secondary | ICD-10-CM

## 2016-04-02 ENCOUNTER — Other Ambulatory Visit: Payer: Self-pay | Admitting: Internal Medicine

## 2016-04-02 DIAGNOSIS — Z1231 Encounter for screening mammogram for malignant neoplasm of breast: Secondary | ICD-10-CM

## 2016-05-07 ENCOUNTER — Ambulatory Visit
Admission: RE | Admit: 2016-05-07 | Discharge: 2016-05-07 | Disposition: A | Discharge status: Home / Self Care | Source: Ambulatory Visit | Attending: Internal Medicine | Admitting: Internal Medicine

## 2016-05-07 DIAGNOSIS — Z1231 Encounter for screening mammogram for malignant neoplasm of breast: Secondary | ICD-10-CM

## 2016-06-14 ENCOUNTER — Encounter: Admitting: Neurology

## 2016-06-14 ENCOUNTER — Encounter

## 2016-06-14 ENCOUNTER — Telehealth: Payer: Self-pay | Admitting: *Deleted

## 2016-06-14 NOTE — Telephone Encounter (Signed)
Called and LVM for pt letting her know her appt is cx due to power outage. Office closed. Advised we will call  Back to r/s her appt.

## 2016-07-07 ENCOUNTER — Encounter: Payer: Self-pay | Admitting: Neurology

## 2016-07-07 ENCOUNTER — Ambulatory Visit (INDEPENDENT_AMBULATORY_CARE_PROVIDER_SITE_OTHER): Admitting: Neurology

## 2016-07-07 ENCOUNTER — Ambulatory Visit (INDEPENDENT_AMBULATORY_CARE_PROVIDER_SITE_OTHER): Payer: Self-pay | Admitting: Neurology

## 2016-07-07 ENCOUNTER — Encounter (INDEPENDENT_AMBULATORY_CARE_PROVIDER_SITE_OTHER): Payer: Self-pay

## 2016-07-07 DIAGNOSIS — R202 Paresthesia of skin: Secondary | ICD-10-CM

## 2016-07-07 DIAGNOSIS — G5621 Lesion of ulnar nerve, right upper limb: Secondary | ICD-10-CM | POA: Diagnosis not present

## 2016-07-07 NOTE — Progress Notes (Signed)
Please refer to EMG and nerve conduction study procedure note. 

## 2016-07-07 NOTE — Procedures (Signed)
     HISTORY:  Jasmine Harrington is a 61 year old patient with a history of numbness in the fourth and fifth fingers of the right hand that has been present for several months. The patient denies any neck pain or pain down the right arm. She is being evaluated for a possible neuropathy or a cervical radiculopathy.  NERVE CONDUCTION STUDIES:  Nerve conduction studies were performed on both upper extremities. The distal motor latency for the right median and ulnar nerves were normal, with normal motor amplitudes for these nerves. The nerve conduction velocities for the right median and ulnar nerves were normal. The study of the left ulnar nerve revealed a normal distal motor latency and motor amplitude and normal nerve conduction velocities. The sensory latency for the right ulnar nerve was unobtainable, the orthodromic right ulnar sensory latency was also unobtainable as was the right dorsal ulnar cutaneous nerve study. The sensory latencies for the left ulnar nerve and for the right median and radial nerves were within normal limits. The left ulnar orthodromic sensory latency was normal. The left dorsal ulnar cutaneous entry latency was normal. The F wave latencies for the right median nerve and left ulnar nerve were normal.    EMG STUDIES:  EMG study was performed on the right upper extremity:  The first dorsal interosseous muscle reveals 2 to 4 K units with full recruitment. No fibrillations or positive waves were noted. The abductor pollicis brevis muscle reveals 2 to 4 K units with full recruitment. No fibrillations or positive waves were noted. The abductor digiti quinti muscle reveals 2 to 3 K units with full recruitment. No fibrillations or positive waves were noted. The extensor indicis proprius muscle reveals 1 to 3 K units with full recruitment. No fibrillations or positive waves were noted. The pronator teres muscle reveals 2 to 3 K units with full recruitment. No fibrillations or positive  waves were noted. The flexor digitorum profundus muscle (III-IV) reveals 2 to 3 K units with full recruitment. No fibrillations or positive waves were noted. The biceps muscle reveals 1 to 2 K units with full recruitment. No fibrillations or positive waves were noted. The triceps muscle reveals 2 to 4 K units with full recruitment. No fibrillations or positive waves were noted. The anterior deltoid muscle reveals 2 to 3 K units with full recruitment. No fibrillations or positive waves were noted. The cervical paraspinal muscles were tested at 2 levels. No abnormalities of insertional activity were seen at either level tested. There was good relaxation.   IMPRESSION:  Nerve conduction studies done on both upper extremities shows evidence of sensory abnormalities for the right ulnar nerve, otherwise the motor component to the right ulnar nerve was unaffected, no evidence of carpal tunnel syndrome was seen on the right. The left ulnar nerve function was normal. EMG evaluation of the right upper extremity shows no evidence of an overlying cervical radiculopathy. No denervation was seen within the right ulnar nerve distribution.  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  Marlan Palau. Keith Kelia Gibbon MD 07/07/2016 3:06 PM  [facility] [address] [place], [place] [id number]  Phone [phone] Fax [phone]

## 2017-06-22 DIAGNOSIS — Z0000 Encounter for general adult medical examination without abnormal findings: Secondary | ICD-10-CM | POA: Insufficient documentation

## 2017-06-22 LAB — HEPATIC FUNCTION PANEL
ALT: 18 (ref 7–35)
AST: 22 (ref 13–35)
Alkaline Phosphatase: 83 (ref 25–125)
BILIRUBIN, TOTAL: 0.3

## 2017-06-22 LAB — LIPID PANEL
Cholesterol: 192 (ref 0–200)
HDL: 48 (ref 35–70)
LDL CALC: 125
LDL/HDL RATIO: 2.6
Triglycerides: 97 (ref 40–160)

## 2017-06-22 LAB — BASIC METABOLIC PANEL
BUN: 11 (ref 4–21)
CREATININE: 0.9 (ref 0.5–1.1)
GLUCOSE: 108
POTASSIUM: 3.8 (ref 3.4–5.3)
SODIUM: 142 (ref 137–147)

## 2017-06-22 LAB — CBC AND DIFFERENTIAL
HCT: 43 (ref 36–46)
Hemoglobin: 14 (ref 12.0–16.0)
Platelets: 255 (ref 150–399)
WBC: 6.7

## 2017-06-22 LAB — VITAMIN D 25 HYDROXY (VIT D DEFICIENCY, FRACTURES): Vit D, 25-Hydroxy: 36.4

## 2017-06-22 LAB — HEMOGLOBIN A1C: Hemoglobin A1C: 5.6

## 2017-10-03 LAB — HM MAMMOGRAPHY: HM Mammogram: NORMAL (ref 0–4)

## 2017-10-03 LAB — HM PAP SMEAR: HM Pap smear: NEGATIVE

## 2017-10-03 LAB — HM DEXA SCAN: HM Dexa Scan: NORMAL

## 2017-10-30 HISTORY — PX: COLONOSCOPY: SHX174

## 2017-11-11 LAB — HM COLONOSCOPY

## 2017-12-11 ENCOUNTER — Encounter: Payer: Self-pay | Admitting: Internal Medicine

## 2017-12-11 DIAGNOSIS — Z0000 Encounter for general adult medical examination without abnormal findings: Secondary | ICD-10-CM

## 2017-12-19 ENCOUNTER — Other Ambulatory Visit: Payer: Self-pay

## 2017-12-19 ENCOUNTER — Encounter: Payer: Self-pay | Admitting: Internal Medicine

## 2017-12-19 ENCOUNTER — Ambulatory Visit (INDEPENDENT_AMBULATORY_CARE_PROVIDER_SITE_OTHER): Admitting: Internal Medicine

## 2017-12-19 VITALS — BP 122/84 | HR 64 | Temp 98.2°F | Ht 61.5 in | Wt 243.4 lb

## 2017-12-19 DIAGNOSIS — J301 Allergic rhinitis due to pollen: Secondary | ICD-10-CM

## 2017-12-19 DIAGNOSIS — I10 Essential (primary) hypertension: Secondary | ICD-10-CM

## 2017-12-19 DIAGNOSIS — Z6841 Body Mass Index (BMI) 40.0 and over, adult: Secondary | ICD-10-CM

## 2017-12-19 DIAGNOSIS — R7309 Other abnormal glucose: Secondary | ICD-10-CM | POA: Diagnosis not present

## 2017-12-19 DIAGNOSIS — K219 Gastro-esophageal reflux disease without esophagitis: Secondary | ICD-10-CM

## 2017-12-19 MED ORDER — OLMESARTAN MEDOXOMIL 40 MG PO TABS: 40.0000 mg | ORAL_TABLET | Freq: Every day | ORAL | 1 refills | Status: DC

## 2017-12-19 MED ORDER — AZELASTINE HCL 0.1 % NA SOLN: 1.0000 | Freq: Two times a day (BID) | NASAL | 3 refills | Status: DC

## 2017-12-19 NOTE — Progress Notes (Signed)
Subjective:     Patient ID: Jasmine Harrington , female    DOB: 1956/01/07 , 62 y.o.   MRN: [id number]   Hypertension  This is a chronic problem. The current episode started more than 1 year ago. The problem is unchanged. The problem is controlled. Pertinent negatives include no blurred vision, chest pain, headaches, palpitations or shortness of breath. There are no associated agents to hypertension.  REPORTS COMPLIANCE WITH MEDS.    Past Medical History:  Diagnosis Date  . Arthritis   . Hypertension   . Pneumonia   . Tuberculosis    pt was pos for tb . had to take 6 months of meds .       Current Outpatient Medications:  .  azelastine (ASTELIN) 0.1 % nasal spray, Place 1 spray into both nostrils 2 (two) times daily. Use in each nostril as directed, Disp: 90 mL, Rfl: 3 .  cetirizine (ZYRTEC) 10 MG tablet, Take 10 mg by mouth daily., Disp: , Rfl:  .  cholecalciferol (VITAMIN D) 1000 UNITS tablet, Take 1,000 Units by mouth 4 (four) times daily., Disp: , Rfl:  .  hydrochlorothiazide (MICROZIDE) 12.5 MG capsule, Take 12.5 mg by mouth daily., Disp: , Rfl:  .  loratadine (CLARITIN) 10 MG tablet, Take 10 mg by mouth daily., Disp: , Rfl:  .  olmesartan (BENICAR) 40 MG tablet, Take 1 tablet (40 mg total) by mouth daily., Disp: 90 tablet, Rfl: 1 .  omega-3 acid ethyl esters (LOVAZA) 1 G capsule, Take 2 g by mouth 2 (two) times daily., Disp: , Rfl:  .  ranitidine (ZANTAC) 150 MG tablet, Take 150 mg by mouth 2 (two) times daily., Disp: , Rfl:  .  VITAMIN E PO, Take by mouth., Disp: , Rfl:    Allergies  Allergen Reactions  . Aspirin     rash  . Acetomenaphthone (Menadiol Diacetate) [Menadiol Sodium Diphosphate] Hives and Rash  . Penicillins Rash     Review of Systems  Constitutional: Negative.   HENT: Positive for postnasal drip and rhinorrhea.   Eyes: Negative.  Negative for blurred vision.  Respiratory: Negative.  Negative for shortness of breath.   Cardiovascular: Negative.  Negative for  chest pain and palpitations.  Gastrointestinal: Negative.        She has reflux. Controlled with ZANTAC  Genitourinary: Negative.   Neurological: Negative for headaches.  Psychiatric/Behavioral: Negative.      Today's Vitals   12/19/17 0850  BP: 122/84  Pulse: 64  Temp: 98.2 F (36.8 C)  TempSrc: Oral  Weight: 243 lb 6.4 oz (110.4 kg)  Height: 5' 1.5" (1.562 m)   Body mass index is 45.25 kg/m.   Objective:  Physical Exam  Constitutional: She is oriented to person, place, and time. She appears well-developed and well-nourished.  Eyes: EOM are normal.  Cardiovascular: Normal rate, regular rhythm and normal heart sounds.  Pulmonary/Chest: Effort normal and breath sounds normal.  Neurological: She is alert and oriented to person, place, and time.  Psychiatric: She has a normal mood and affect.  Nursing note and vitals reviewed.       Assessment And Plan:     1. Essential hypertension WELL CONTROLLED. SHE WILL CONTINUE WITH CURRENT MEDS. SHE WILL RTO IN SIX MONTHS FOR HER NEXT PHYSICAL EXAMINATION.   2. GERD without esophagitis CHRONIC. SHE WILL CONTINUE WITH ZANTAC ONCE DAILY.  PT ADVISED I MAY BE ABLE TO WEAN HER OFF COMPLETELY ONCE SHE NEARS HER IDEAL BODY WEIGHT.   3. Seasonal  allergic rhinitis due to pollen CHRONIC. SHE WILL CONTINUE WITH ZYRTEC NIGHTLY.   4. Other abnormal glucose HER A1C HAS BEEN ELEVATED IN THE PAST. I WILL CHECK AN A1C, BMET TODAY. SHE WAS ENCOURAGED TO AVOID SUGARY BEVERAGES AND PROCESSED FOODS INCLUDNG BREADS, RICE AND PASTA.   5. Body mass index (BMI) 45.0-49.9, adult (HCC) SHE IS ENCOURAGED TO STRIVE FOR BMI LESS THAN 40 TO DECREASE CARDIAC RISK.  6. Morbid obesity (HCC) SHE HAS LOST 7 POUNDS SINCE HER LAST VISIT. SHE WAS CONGRATULATED ON HER WEIGHT LOSS EFFORTS AND ENCOURAGED TO KEEP UP THE GREAT WORK.         Gwynneth Aliment, MD

## 2017-12-19 NOTE — Patient Instructions (Signed)
Exercising to Lose Weight Exercising can help you to lose weight. In order to lose weight through exercise, you need to do vigorous-intensity exercise. You can tell that you are exercising with vigorous intensity if you are breathing very hard and fast and cannot hold a conversation while exercising. Moderate-intensity exercise helps to maintain your current weight. You can tell that you are exercising at a moderate level if you have a higher heart rate and faster breathing, but you are still able to hold a conversation. How often should I exercise? Choose an activity that you enjoy and set realistic goals. Your health care provider can help you to make an activity plan that works for you. Exercise regularly as directed by your health care provider. This may include:  Doing resistance training twice each week, such as: ? Push-ups. ? Sit-ups. ? Lifting weights. ? Using resistance bands.  Doing a given intensity of exercise for a given amount of time. Choose from these options: ? 150 minutes of moderate-intensity exercise every week. ? 75 minutes of vigorous-intensity exercise every week. ? A mix of moderate-intensity and vigorous-intensity exercise every week.  Children, pregnant women, people who are out of shape, people who are overweight, and older adults may need to consult a health care provider for individual recommendations. If you have any sort of medical condition, be sure to consult your health care provider before starting a new exercise program. What are some activities that can help me to lose weight?  Walking at a rate of at least 4.5 miles an hour.  Jogging or running at a rate of 5 miles per hour.  Biking at a rate of at least 10 miles per hour.  Lap swimming.  Roller-skating or in-line skating.  Cross-country skiing.  Vigorous competitive sports, such as football, basketball, and soccer.  Jumping rope.  Aerobic dancing. How can I be more active in my day-to-day  activities?  Use the stairs instead of the elevator.  Take a walk during your lunch break.  If you drive, park your car farther away from work or school.  If you take public transportation, get off one stop early and walk the rest of the way.  Make all of your phone calls while standing up and walking around.  Get up, stretch, and walk around every 30 minutes throughout the day. What guidelines should I follow while exercising?  Do not exercise so much that you hurt yourself, feel dizzy, or get very short of breath.  Consult your health care provider prior to starting a new exercise program.  Wear comfortable clothes and shoes with good support.  Drink plenty of water while you exercise to prevent dehydration or heat stroke. Body water is lost during exercise and must be replaced.  Work out until you breathe faster and your heart beats faster. This information is not intended to replace advice given to you by your health care provider. Make sure you discuss any questions you have with your health care provider. Document Released: 03/20/2010 Document Revised: 07/24/2015 Document Reviewed: 07/19/2013 Elsevier Interactive Patient Education  2018 Elsevier Inc.  

## 2017-12-20 LAB — HEMOGLOBIN A1C
Est. average glucose Bld gHb Est-mCnc: 117 mg/dL
Hgb A1c MFr Bld: 5.7 % — ABNORMAL HIGH (ref 4.8–5.6)

## 2017-12-20 LAB — LIPID PANEL
Chol/HDL Ratio: 4.5 ratio — ABNORMAL HIGH (ref 0.0–4.4)
Cholesterol, Total: 189 mg/dL (ref 100–199)
HDL: 42 mg/dL (ref >39)
LDL CALC: 127 mg/dL — AB (ref 0–99)
TRIGLYCERIDES: 101 mg/dL (ref 0–149)
VLDL Cholesterol Cal: 20 mg/dL (ref 5–40)

## 2017-12-20 LAB — CMP14+EGFR
A/G RATIO: 1.3 (ref 1.2–2.2)
ALBUMIN: 4.2 g/dL (ref 3.6–4.8)
ALT: 20 IU/L (ref 0–32)
AST: 33 IU/L (ref 0–40)
Alkaline Phosphatase: 75 IU/L (ref 39–117)
BUN / CREAT RATIO: 19 (ref 12–28)
BUN: 19 mg/dL (ref 8–27)
Bilirubin Total: <0.2 mg/dL (ref 0.0–1.2)
CALCIUM: 9.7 mg/dL (ref 8.7–10.3)
CO2: 15 mmol/L — AB (ref 20–29)
CREATININE: 1.01 mg/dL — AB (ref 0.57–1.00)
Chloride: 106 mmol/L (ref 96–106)
GFR calc Af Amer: 69 mL/min/{1.73_m2} (ref >59)
GFR, EST NON AFRICAN AMERICAN: 60 mL/min/{1.73_m2} (ref >59)
GLOBULIN, TOTAL: 3.3 g/dL (ref 1.5–4.5)
Glucose: 98 mg/dL (ref 65–99)
POTASSIUM: 4.6 mmol/L (ref 3.5–5.2)
SODIUM: 145 mmol/L — AB (ref 134–144)
Total Protein: 7.5 g/dL (ref 6.0–8.5)

## 2017-12-20 NOTE — Progress Notes (Signed)
Your kidney function has decreased. Additionally, your sodium level is elevated. This implies that you are not drinking enough water.  Your liver function is normal. Your LDL, bad cholesterol, is 127. Ideally, this should be less than 100.  Please increase your exercise to four to five days weekly for at least 30 minutes. Your hba1c is 5.7, this is in prediabetes range. Please avoid sugary beverages.

## 2017-12-22 ENCOUNTER — Other Ambulatory Visit: Payer: Self-pay

## 2017-12-22 MED ORDER — CETIRIZINE HCL 10 MG PO TABS: 10.0000 mg | ORAL_TABLET | Freq: Every day | ORAL | 2 refills | Status: DC

## 2018-01-16 ENCOUNTER — Other Ambulatory Visit: Payer: Self-pay

## 2018-01-16 MED ORDER — HYDROCHLOROTHIAZIDE 12.5 MG PO CAPS: 12.5000 mg | ORAL_CAPSULE | Freq: Every day | ORAL | 1 refills | Status: DC

## 2018-01-16 MED ORDER — LORATADINE 10 MG PO TABS: 10.0000 mg | ORAL_TABLET | Freq: Every day | ORAL | 1 refills | Status: DC

## 2018-03-16 ENCOUNTER — Ambulatory Visit (HOSPITAL_COMMUNITY)
Admission: EM | Admit: 2018-03-16 | Discharge: 2018-03-16 | Disposition: A | Discharge status: Home / Self Care | Attending: Family Medicine | Admitting: Family Medicine

## 2018-03-16 ENCOUNTER — Encounter (HOSPITAL_COMMUNITY): Payer: Self-pay

## 2018-03-16 ENCOUNTER — Other Ambulatory Visit: Payer: Self-pay

## 2018-03-16 DIAGNOSIS — J320 Chronic maxillary sinusitis: Secondary | ICD-10-CM | POA: Insufficient documentation

## 2018-03-16 MED ORDER — AZITHROMYCIN 250 MG PO TABS: ORAL_TABLET | ORAL | 0 refills | Status: DC

## 2018-03-16 NOTE — ED Provider Notes (Signed)
[facility]    CSN: [id number] Arrival date & time: 03/16/18  1853     History   Chief Complaint Chief Complaint  Patient presents with  . Breathing Problem    HPI Jasmine Harrington is a 63 y.o. female.   Complains of shortness of breath also complains of postnasal drainage that she spits out it is yellow-green.  Also has some sinus pressure and pain.  She has heard some wheezing when she lays down.  She has been out of her Benicar but blood pressure is good.  Denies fever or excessive coughing  HPI  Past Medical History:  Diagnosis Date  . Arthritis   . Hypertension   . Pneumonia   . Tuberculosis    pt was pos for tb . had to take 6 months of meds .     Patient Active Problem List   Diagnosis Date Noted  . Encounter for general adult medical examination without abnormal findings 06/22/2017  . Paresthesia 07/07/2016  . Hypertension 12/14/2011    Past Surgical History:  Procedure Laterality Date  . ABDOMINAL HYSTERECTOMY  2008  . COLONOSCOPY  10/2017  . KNEE SURGERY Right 1965    OB History    Gravida  2   Para  1   Term  1   Preterm      AB  1   Living  1     SAB  1   TAB      Ectopic      Multiple      Live Births               Home Medications    Prior to Admission medications   Medication Sig Start Date End Date Taking? Authorizing Provider  cetirizine (ZYRTEC) 10 MG tablet Take 1 tablet (10 mg total) by mouth daily. 12/22/17  Yes Dorothyann PengSanders, Robyn, MD  cholecalciferol (VITAMIN D) 1000 UNITS tablet Take 1,000 Units by mouth 4 (four) times daily.   Yes [provider]  hydrochlorothiazide (MICROZIDE) 12.5 MG capsule Take 1 capsule (12.5 mg total) by mouth daily. 01/16/18  Yes Dorothyann PengSanders, Robyn, MD  olmesartan (BENICAR) 40 MG tablet Take 1 tablet (40 mg total) by mouth daily. 12/19/17  Yes Dorothyann PengSanders, Robyn, MD  omega-3 acid ethyl esters (LOVAZA) 1 G capsule Take 2 g by mouth 2 (two) times daily.   Yes [provider]    ranitidine (ZANTAC) 150 MG tablet Take 150 mg by mouth 2 (two) times daily.   Yes [provider]  VITAMIN E PO Take by mouth.   Yes [provider]  azelastine (ASTELIN) 0.1 % nasal spray Place 1 spray into both nostrils 2 (two) times daily. Use in each nostril as directed 12/19/17   Dorothyann PengSanders, Robyn, MD  azithromycin (ZITHROMAX Z-PAK) 250 MG tablet Take as directed 03/16/18   Frederica KusterMiller, Elie Gragert M, MD  loratadine (CLARITIN) 10 MG tablet Take 1 tablet (10 mg total) by mouth daily. 01/16/18   Dorothyann PengSanders, Robyn, MD    Family History Family History  Problem Relation Age of Onset  . Hypertension Paternal Grandfather   . Hypertension Paternal Grandmother   . Hypertension Maternal Grandmother   . Hypertension Maternal Grandfather   . Prostate cancer Father   . Breast cancer Mother   . Leukemia Brother     Social History Social History   Tobacco Use  . Smoking status: Never Smoker  . Smokeless tobacco: Never Used  Substance Use Topics  . Alcohol use: No  .  Drug use: No     Allergies   Aspirin; Acetomenaphthone (menadiol diacetate) [menadiol sodium diphosphate]; and Penicillins   Review of Systems Review of Systems  HENT: Positive for postnasal drip and sinus pressure.   Respiratory: Positive for shortness of breath.   All other systems reviewed and are negative.    Physical Exam Triage Vital Signs ED Triage Vitals  Enc Vitals Group     BP 03/16/18 1858 123/69     Pulse Rate 03/16/18 1858 72     Resp 03/16/18 1858 17     Temp 03/16/18 1858 (!) 97.1 F (36.2 C)     Temp Source 03/16/18 1858 Oral     SpO2 03/16/18 1858 100 %     Weight --      Height --      Head Circumference --      Peak Flow --      Pain Score 03/16/18 1859 0     Pain Loc --      Pain Edu? --      Excl. in GC? --    No data found.  Updated Vital Signs BP 123/69 (BP Location: Left Arm)   Pulse 72   Temp (!) 97.1 F (36.2 C) (Oral)   Resp 17   SpO2 100%   Visual Acuity Right  Eye Distance:   Left Eye Distance:   Bilateral Distance:    Right Eye Near:   Left Eye Near:    Bilateral Near:     Physical Exam Vitals signs and nursing note reviewed.  Constitutional:      Appearance: Normal appearance. She is obese.  HENT:     Head: Normocephalic.     Right Ear: Tympanic membrane normal.     Left Ear: Tympanic membrane normal.     Ears:     Comments: There is sinus tenderness in the maxillary's    Nose: Nose normal.     Mouth/Throat:     Mouth: Mucous membranes are moist.     Pharynx: Oropharynx is clear.  Neck:     Musculoskeletal: Normal range of motion.  Cardiovascular:     Rate and Rhythm: Normal rate and regular rhythm.     Heart sounds: Normal heart sounds.  Pulmonary:     Effort: Pulmonary effort is normal.     Breath sounds: Normal breath sounds.  Neurological:     General: No focal deficit present.     Mental Status: She is alert and oriented to person, place, and time.      UC Treatments / Results  Labs (all labs ordered are listed, but only abnormal results are displayed) Labs Reviewed - No data to display  EKG None  Radiology No results found.  Procedures Procedures (including critical care time)  Medications Ordered in UC Medications - No data to display  Initial Impression / Assessment and Plan / UC Course  I have reviewed the triage vital signs and the nursing notes.  Pertinent labs & imaging results that were available during my care of the patient were reviewed by me and considered in my medical decision making (see chart for details).     Shortness of breath, likely related to postnasal drainage and accumulation of mucus in the trachea, which also is causing some wheezing.  Will treat with antibiotic and holding antihistamine which is likely thickening up the drainage Final Clinical Impressions(s) / UC Diagnoses   Final diagnoses:  Chronic maxillary sinusitis     Discharge Instructions  Take extra dose of  diuretic when you get home Hold Zyrtec and use nasal steroid for allergic symptoms   ED Prescriptions    Medication Sig Dispense Auth. Provider   azithromycin (ZITHROMAX Z-PAK) 250 MG tablet Take as directed 6 each Frederica Kuster, MD     Controlled Substance Prescriptions [place] Controlled Substance Registry consulted? No   Frederica Kuster, MD 03/16/18 1943

## 2018-03-16 NOTE — Discharge Instructions (Addendum)
Take extra dose of diuretic when you get home Hold Zyrtec and use nasal steroid for allergic symptoms

## 2018-03-16 NOTE — ED Triage Notes (Signed)
[facility], pt states she is out of Benicar.

## 2018-03-17 ENCOUNTER — Other Ambulatory Visit: Payer: Self-pay

## 2018-04-03 ENCOUNTER — Ambulatory Visit (INDEPENDENT_AMBULATORY_CARE_PROVIDER_SITE_OTHER): Admitting: Nurse Practitioner

## 2018-04-03 ENCOUNTER — Encounter: Payer: Self-pay | Admitting: Nurse Practitioner

## 2018-04-03 VITALS — BP 120/76 | HR 68 | Temp 98.0°F | Ht 62.6 in | Wt 238.4 lb

## 2018-04-03 DIAGNOSIS — J209 Acute bronchitis, unspecified: Secondary | ICD-10-CM | POA: Diagnosis not present

## 2018-04-03 MED ORDER — PREDNISONE 20 MG PO TABS: 20.0000 mg | ORAL_TABLET | Freq: Every day | ORAL | 0 refills | Status: DC

## 2018-04-03 MED ORDER — ALBUTEROL SULFATE HFA 108 (90 BASE) MCG/ACT IN AERS: 2.0000 | INHALATION_SPRAY | Freq: Four times a day (QID) | RESPIRATORY_TRACT | 2 refills | Status: DC | PRN

## 2018-04-03 MED ORDER — HYDROCODONE-HOMATROPINE 5-1.5 MG/5ML PO SYRP: 5.0000 mL | ORAL_SOLUTION | Freq: Four times a day (QID) | ORAL | 0 refills | Status: DC | PRN

## 2018-04-03 NOTE — Progress Notes (Signed)
Subjective:     Patient ID: Jasmine Harrington , female    DOB: Jul 25, 1955 , 63 y.o.   MRN: [id number]   Chief Complaint  Patient presents with  . Wheezing  . chest congestion    HPI  2 weeks ago seen at Urgent Care and given azithromycin  Cough  This is a recurrent problem. The current episode started in the past 7 days. The cough is productive of sputum. Associated symptoms include nasal congestion and wheezing. Pertinent negatives include no chest pain, chills, fever, postnasal drip or sore throat. She has tried OTC cough suppressant for the symptoms. There is no history of asthma.     Past Medical History:  Diagnosis Date  . Arthritis   . Hypertension   . Pneumonia   . Tuberculosis    pt was pos for tb . had to take 6 months of meds .      Family History  Problem Relation Age of Onset  . Hypertension Paternal Grandfather   . Hypertension Paternal Grandmother   . Hypertension Maternal Grandmother   . Hypertension Maternal Grandfather   . Prostate cancer Father   . Breast cancer Mother   . Leukemia Brother      Current Outpatient Medications:  .  azelastine (ASTELIN) 0.1 % nasal spray, Place 1 spray into both nostrils 2 (two) times daily. Use in each nostril as directed, Disp: 90 mL, Rfl: 3 .  cetirizine (ZYRTEC) 10 MG tablet, Take 1 tablet (10 mg total) by mouth daily., Disp: 90 tablet, Rfl: 2 .  cholecalciferol (VITAMIN D) 1000 UNITS tablet, Take 1,000 Units by mouth 4 (four) times daily., Disp: , Rfl:  .  hydrochlorothiazide (MICROZIDE) 12.5 MG capsule, Take 1 capsule (12.5 mg total) by mouth daily., Disp: 90 capsule, Rfl: 1 .  loratadine (CLARITIN) 10 MG tablet, Take 1 tablet (10 mg total) by mouth daily., Disp: 90 tablet, Rfl: 1 .  olmesartan (BENICAR) 40 MG tablet, Take 1 tablet (40 mg total) by mouth daily., Disp: 90 tablet, Rfl: 1 .  omega-3 acid ethyl esters (LOVAZA) 1 G capsule, Take 2 g by mouth 2 (two) times daily., Disp: , Rfl:  .  ranitidine (ZANTAC) 150 MG  tablet, Take 150 mg by mouth 2 (two) times daily., Disp: , Rfl:  .  VITAMIN E PO, Take by mouth., Disp: , Rfl:  .  azithromycin (ZITHROMAX Z-PAK) 250 MG tablet, Take as directed (Patient not taking: Reported on 04/03/2018), Disp: 6 each, Rfl: 0   Allergies  Allergen Reactions  . Aspirin     rash  . Acetomenaphthone (Menadiol Diacetate) [Menadiol Sodium Diphosphate] Hives and Rash  . Penicillins Rash     Review of Systems  Constitutional: Negative for chills and fever.  HENT: Positive for congestion. Negative for postnasal drip and sore throat.   Eyes: Negative.   Respiratory: Positive for cough and wheezing.   Cardiovascular: Negative for chest pain.  Endocrine: Negative for polydipsia, polyphagia and polyuria.  Musculoskeletal: Negative.      Today's Vitals   04/03/18 1609  BP: 120/76  Pulse: 68  Temp: 98 F (36.7 C)  TempSrc: Oral  SpO2: 97%  Weight: 238 lb 6.4 oz (108.1 kg)  Height: 5' 2.6" (1.59 m)   Body mass index is 42.77 kg/m.   Objective:  Physical Exam Constitutional:      Appearance: Normal appearance.  HENT:     Head: Normocephalic and atraumatic.     Right Ear: Tympanic membrane, ear canal and external  ear normal. There is no impacted cerumen.     Left Ear: Tympanic membrane, ear canal and external ear normal. There is no impacted cerumen.     Nose: Nose normal.  Cardiovascular:     Rate and Rhythm: Normal rate and regular rhythm.     Pulses: Normal pulses.     Heart sounds: Normal heart sounds. No murmur.  Pulmonary:     Effort: Pulmonary effort is normal. No respiratory distress.     Breath sounds: Wheezing present.  Skin:    General: Skin is warm and dry.  Neurological:     General: No focal deficit present.     Mental Status: She is alert and oriented to person, place, and time.  Psychiatric:        Mood and Affect: Mood normal.         Assessment And Plan:     1. Acute bronchitis, unspecified organism  Wheezes heard throughout -  predniSONE (DELTASONE) 20 MG tablet; Take 1 tablet (20 mg total) by mouth daily with breakfast.  Dispense: 21 tablet; Refill: 0 - albuterol (PROVENTIL HFA;VENTOLIN HFA) 108 (90 Base) MCG/ACT inhaler; Inhale 2 puffs into the lungs every 6 (six) hours as needed for wheezing or shortness of breath.  Dispense: 1 Inhaler; Refill: 2 - HYDROcodone-homatropine (HYDROMET) 5-1.5 MG/5ML syrup; Take 5 mLs by mouth every 6 (six) hours as needed.  Dispense: 120 mL; Refill: 0       Arnette Felts, FNP

## 2018-04-18 ENCOUNTER — Encounter: Payer: Self-pay | Admitting: Nurse Practitioner

## 2018-05-30 ENCOUNTER — Other Ambulatory Visit: Payer: Self-pay

## 2018-05-30 MED ORDER — OLMESARTAN MEDOXOMIL 40 MG PO TABS: 40.0000 mg | ORAL_TABLET | Freq: Every day | ORAL | 1 refills | Status: DC

## 2018-05-31 ENCOUNTER — Other Ambulatory Visit: Payer: Self-pay

## 2018-05-31 MED ORDER — OLMESARTAN MEDOXOMIL 40 MG PO TABS: 40.0000 mg | ORAL_TABLET | Freq: Every day | ORAL | 1 refills | Status: DC

## 2018-07-25 ENCOUNTER — Encounter: Payer: Self-pay | Admitting: Internal Medicine

## 2018-07-25 ENCOUNTER — Other Ambulatory Visit: Payer: Self-pay

## 2018-07-25 ENCOUNTER — Ambulatory Visit (INDEPENDENT_AMBULATORY_CARE_PROVIDER_SITE_OTHER): Admitting: Internal Medicine

## 2018-07-25 VITALS — BP 124/88 | HR 62 | Temp 97.9°F | Ht 62.8 in | Wt 250.8 lb

## 2018-07-25 DIAGNOSIS — R7309 Other abnormal glucose: Secondary | ICD-10-CM

## 2018-07-25 DIAGNOSIS — I10 Essential (primary) hypertension: Secondary | ICD-10-CM | POA: Diagnosis not present

## 2018-07-25 DIAGNOSIS — R635 Abnormal weight gain: Secondary | ICD-10-CM

## 2018-07-25 DIAGNOSIS — Z0000 Encounter for general adult medical examination without abnormal findings: Secondary | ICD-10-CM

## 2018-07-25 DIAGNOSIS — Z6841 Body Mass Index (BMI) 40.0 and over, adult: Secondary | ICD-10-CM

## 2018-07-25 LAB — POCT URINALYSIS DIPSTICK
Bilirubin, UA: NEGATIVE
Blood, UA: NEGATIVE
Glucose, UA: NEGATIVE
Ketones, UA: NEGATIVE
Nitrite, UA: NEGATIVE
Protein, UA: NEGATIVE
Spec Grav, UA: 1.025 (ref 1.010–1.025)
Urobilinogen, UA: 0.2 E.U./dL
pH, UA: 5 (ref 5.0–8.0)

## 2018-07-25 LAB — POCT UA - MICROALBUMIN
Albumin/Creatinine Ratio, Urine, POC: <30
Creatinine, POC: 100 mg/dL
Microalbumin Ur, POC: 10 mg/L

## 2018-07-25 MED ORDER — HYDROCHLOROTHIAZIDE 12.5 MG PO CAPS: 12.5000 mg | ORAL_CAPSULE | Freq: Every day | ORAL | 1 refills | Status: DC

## 2018-07-25 NOTE — Patient Instructions (Signed)

## 2018-07-25 NOTE — Progress Notes (Signed)
Subjective:     Patient ID: Jasmine Harrington , female    DOB: 1955/10/30 , 63 y.o.   MRN: [id number]   Chief Complaint  Patient presents with  . Annual Exam  . Hypertension    HPI  She is here today for a full physical examination. She is followed by Dr. Cletis Media for her pelvic exams. She was last seen in 2019. She has no specific concerns or complaints at this time. She has her mammograms performed at Dr. Boyd Kerbs office.   Hypertension  This is a chronic problem. The current episode started more than 1 year ago. The problem has been gradually improving since onset. The problem is controlled. Pertinent negatives include no blurred vision, chest pain, palpitations or shortness of breath. Risk factors for coronary artery disease include obesity, sedentary lifestyle and post-menopausal state.     Past Medical History:  Diagnosis Date  . Arthritis   . Hypertension   . Pneumonia   . Tuberculosis    pt was pos for tb . had to take 6 months of meds .      Family History  Problem Relation Age of Onset  . Hypertension Paternal Grandfather   . Hypertension Paternal Grandmother   . Hypertension Maternal Grandmother   . Hypertension Maternal Grandfather   . Prostate cancer Father   . Breast cancer Mother   . Leukemia Brother      Current Outpatient Medications:  .  albuterol (PROVENTIL HFA;VENTOLIN HFA) 108 (90 Base) MCG/ACT inhaler, Inhale 2 puffs into the lungs every 6 (six) hours as needed for wheezing or shortness of breath., Disp: 1 Inhaler, Rfl: 2 .  azelastine (ASTELIN) 0.1 % nasal spray, Place 1 spray into both nostrils 2 (two) times daily. Use in each nostril as directed, Disp: 90 mL, Rfl: 3 .  cetirizine (ZYRTEC) 10 MG tablet, Take 1 tablet (10 mg total) by mouth daily., Disp: 90 tablet, Rfl: 2 .  cholecalciferol (VITAMIN D) 1000 UNITS tablet, Take 1,000 Units by mouth 4 (four) times daily., Disp: , Rfl:  .  hydrochlorothiazide (MICROZIDE) 12.5 MG capsule, Take 1 capsule (12.5 mg  total) by mouth daily., Disp: 90 capsule, Rfl: 1 .  olmesartan (BENICAR) 40 MG tablet, Take 1 tablet (40 mg total) by mouth daily., Disp: 90 tablet, Rfl: 1 .  omega-3 acid ethyl esters (LOVAZA) 1 G capsule, Take 2 g by mouth 2 (two) times daily., Disp: , Rfl:  .  VITAMIN E PO, Take by mouth., Disp: , Rfl:    Allergies  Allergen Reactions  . Aspirin     rash  . Acetomenaphthone (Menadiol Diacetate) [Menadiol Sodium Diphosphate] Hives and Rash  . Penicillins Rash     The patient states she uses post menopausal status for birth control. Last LMP was No LMP recorded. Patient has had a hysterectomy.. Negative for Dysmenorrhea Negative for: breast discharge, breast lump(s), breast pain and breast self exam. Associated symptoms include abnormal vaginal bleeding. Pertinent negatives include abnormal bleeding (hematology), anxiety, decreased libido, depression, difficulty falling sleep, dyspareunia, history of infertility, nocturia, sexual dysfunction, sleep disturbances, urinary incontinence, urinary urgency, vaginal discharge and vaginal itching. Diet regular.The patient states her exercise level is  minimal. She has been scared to walk outside due to COVID-19.   . The patient's tobacco use is:  Social History   Tobacco Use  Smoking Status Never Smoker  Smokeless Tobacco Never Used  . She has been exposed to passive smoke. The patient's alcohol use is:  Social History  Substance and Sexual Activity  Alcohol Use No  . Additional information: Last pap August 2019, next one scheduled for Aug 2020.   Review of Systems  Constitutional: Negative.   HENT: Negative.   Eyes: Negative.  Negative for blurred vision.  Respiratory: Negative.  Negative for shortness of breath.   Cardiovascular: Negative.  Negative for chest pain and palpitations.  Gastrointestinal: Negative.   Endocrine: Negative.   Genitourinary: Negative.   Musculoskeletal: Negative.   Skin: Negative.   Allergic/Immunologic:  Negative.   Neurological: Negative.   Hematological: Negative.   Psychiatric/Behavioral: Negative.      Today's Vitals   07/25/18 0851  BP: 124/88  Pulse: 62  Temp: 97.9 F (36.6 C)  TempSrc: Oral  Weight: 250 lb 12.8 oz (113.8 kg)  Height: 5' 2.8" (1.595 m)   Body mass index is 44.71 kg/m.   Objective:  Physical Exam Vitals signs and nursing note reviewed.  Constitutional:      Appearance: Normal appearance. She is obese.  HENT:     Head: Normocephalic and atraumatic.     Right Ear: Tympanic membrane, ear canal and external ear normal.     Left Ear: Tympanic membrane, ear canal and external ear normal.     Nose: Nose normal.     Mouth/Throat:     Mouth: Mucous membranes are moist.     Pharynx: Oropharynx is clear.  Eyes:     Extraocular Movements: Extraocular movements intact.     Conjunctiva/sclera: Conjunctivae normal.     Pupils: Pupils are equal, round, and reactive to light.  Neck:     Musculoskeletal: Normal range of motion and neck supple.  Cardiovascular:     Rate and Rhythm: Normal rate and regular rhythm.     Pulses: Normal pulses.     Heart sounds: Normal heart sounds.  Pulmonary:     Effort: Pulmonary effort is normal.     Breath sounds: Normal breath sounds.  Chest:     Breasts:        Right: No swelling, bleeding, inverted nipple, mass or nipple discharge.        Left: No swelling, bleeding, inverted nipple, mass or nipple discharge.  Abdominal:     General: Bowel sounds are normal.     Palpations: Abdomen is soft.     Comments: obese  Genitourinary:    Comments: deferred Musculoskeletal: Normal range of motion.     Right lower leg: 1+ Pitting Edema present.     Left lower leg: 1+ Pitting Edema present.  Skin:    General: Skin is warm and dry.  Neurological:     General: No focal deficit present.     Mental Status: She is alert and oriented to person, place, and time.  Psychiatric:        Mood and Affect: Mood normal.        Behavior:  Behavior normal.         Assessment And Plan:     1. Routine general medical examination at health care facility  A full exam was performed.  Importance of monthly self breast exams was discussed with the patient.  PATIENT HAS BEEN ADVISED TO GET 30-45 MINUTES REGULAR EXERCISE NO LESS THAN FOUR TO FIVE DAYS PER WEEK - BOTH WEIGHTBEARING EXERCISES AND AEROBIC ARE RECOMMENDED.  SHE IS ADVISED TO FOLLOW A HEALTHY DIET WITH AT LEAST SIX FRUITS/VEGGIES PER DAY, DECREASE INTAKE OF RED MEAT, AND TO INCREASE FISH INTAKE TO TWO DAYS PER WEEK.  MEATS/FISH  SHOULD NOT BE FRIED, BAKED OR BROILED IS PREFERABLE.  I SUGGEST WEARING SPF 50 SUNSCREEN ON EXPOSED PARTS AND ESPECIALLY WHEN IN THE DIRECT SUNLIGHT FOR AN EXTENDED PERIOD OF TIME.  PLEASE AVOID FAST FOOD RESTAURANTS AND INCREASE YOUR WATER INTAKE.   2. Essential hypertension  Fair control. She will continue with current meds for now. She agrees to rto in four months for re-evaluation.  If still elevated, I will increase hctz to 8m once daily. 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 She denies SOB, DOE and chest discomfort. She was advised to elevate her legs when seated, cut back on her salt intake and to increase activity level to address LE edema.   - EKG 12-Lead - CMP14+EGFR - CBC - Lipid panel  3. Weight gain  She was made aware of 12 pound weight gain. She is encouraged to incorporate more exercise into her daily routine.   4. Other abnormal glucose  HER A1C HAS BEEN ELEVATED IN THE PAST. I WILL CHECK AN A1C, BMET TODAY. SHE WAS ENCOURAGED TO AVOID SUGARY BEVERAGES AND PROCESSED FOODS INCLUDNG BREADS, RICE AND PASTA.  - Hemoglobin A1c  5. Class 3 severe obesity due to excess calories without serious comorbidity with body mass index (BMI) of 40.0 to 44.9 in adult ([facility]  Importance of achieving optimal weight to decrease risk of cardiovascular disease and cancers was discussed with the patient in full detail. She is  encouraged to start slowly - start with 10 minutes twice daily at least three to four days per week and to gradually build to 30 minutes five days weekly. She was given tips to incorporate more activity into her daily routine - take stairs when possible, park farther away from grocery stores, etc.     RMaximino Greenland MD    THE PATIENT IS ENCOURAGED TO PRACTICE SOCIAL DISTANCING DUE TO THE COVID-19 PANDEMIC.

## 2018-07-26 LAB — CMP14+EGFR
ALT: 15 IU/L (ref 0–32)
AST: 21 IU/L (ref 0–40)
Albumin/Globulin Ratio: 1.5 (ref 1.2–2.2)
Albumin: 4.3 g/dL (ref 3.8–4.8)
Alkaline Phosphatase: 80 IU/L (ref 39–117)
BUN/Creatinine Ratio: 18 (ref 12–28)
BUN: 18 mg/dL (ref 8–27)
Bilirubin Total: 0.3 mg/dL (ref 0.0–1.2)
CO2: 21 mmol/L (ref 20–29)
Calcium: 9.6 mg/dL (ref 8.7–10.3)
Chloride: 103 mmol/L (ref 96–106)
Creatinine, Ser: 1 mg/dL (ref 0.57–1.00)
GFR calc Af Amer: 70 mL/min/{1.73_m2} (ref >59)
GFR calc non Af Amer: 61 mL/min/{1.73_m2} (ref >59)
Globulin, Total: 2.8 g/dL (ref 1.5–4.5)
Glucose: 99 mg/dL (ref 65–99)
Potassium: 3.8 mmol/L (ref 3.5–5.2)
Sodium: 141 mmol/L (ref 134–144)
Total Protein: 7.1 g/dL (ref 6.0–8.5)

## 2018-07-26 LAB — LIPID PANEL
Chol/HDL Ratio: 3.7 ratio (ref 0.0–4.4)
Cholesterol, Total: 190 mg/dL (ref 100–199)
HDL: 51 mg/dL (ref >39)
LDL Calculated: 121 mg/dL — ABNORMAL HIGH (ref 0–99)
Triglycerides: 91 mg/dL (ref 0–149)
VLDL Cholesterol Cal: 18 mg/dL (ref 5–40)

## 2018-07-26 LAB — CBC
Hematocrit: 38.7 % (ref 34.0–46.6)
Hemoglobin: 13 g/dL (ref 11.1–15.9)
MCH: 30.1 pg (ref 26.6–33.0)
MCHC: 33.6 g/dL (ref 31.5–35.7)
MCV: 90 fL (ref 79–97)
Platelets: 263 10*3/uL (ref 150–450)
RBC: 4.32 x10E6/uL (ref 3.77–5.28)
RDW: 11.9 % (ref 11.7–15.4)
WBC: 7.2 10*3/uL (ref 3.4–10.8)

## 2018-07-26 LAB — HEMOGLOBIN A1C
Est. average glucose Bld gHb Est-mCnc: 111 mg/dL
Hgb A1c MFr Bld: 5.5 % (ref 4.8–5.6)

## 2018-09-12 ENCOUNTER — Other Ambulatory Visit: Payer: Self-pay

## 2018-09-12 MED ORDER — OLMESARTAN MEDOXOMIL 40 MG PO TABS: 40.0000 mg | ORAL_TABLET | Freq: Every day | ORAL | 1 refills | Status: DC

## 2018-11-20 ENCOUNTER — Other Ambulatory Visit: Payer: Self-pay

## 2018-11-20 MED ORDER — OLMESARTAN MEDOXOMIL 40 MG PO TABS: 40.0000 mg | ORAL_TABLET | Freq: Every day | ORAL | 1 refills | Status: DC

## 2018-11-20 MED ORDER — HYDROCHLOROTHIAZIDE 12.5 MG PO CAPS: 12.5000 mg | ORAL_CAPSULE | Freq: Every day | ORAL | 1 refills | Status: DC

## 2018-11-20 MED ORDER — CETIRIZINE HCL 10 MG PO TABS: 10.0000 mg | ORAL_TABLET | Freq: Every day | ORAL | 2 refills | Status: DC

## 2018-11-27 ENCOUNTER — Encounter: Payer: Self-pay | Admitting: Internal Medicine

## 2018-11-27 ENCOUNTER — Other Ambulatory Visit: Payer: Self-pay

## 2018-11-27 ENCOUNTER — Ambulatory Visit (INDEPENDENT_AMBULATORY_CARE_PROVIDER_SITE_OTHER): Admitting: Internal Medicine

## 2018-11-27 VITALS — BP 144/82 | HR 69 | Temp 98.0°F | Ht 60.4 in | Wt 246.6 lb

## 2018-11-27 DIAGNOSIS — N182 Chronic kidney disease, stage 2 (mild): Secondary | ICD-10-CM

## 2018-11-27 DIAGNOSIS — Z23 Encounter for immunization: Secondary | ICD-10-CM

## 2018-11-27 DIAGNOSIS — Z6841 Body Mass Index (BMI) 40.0 and over, adult: Secondary | ICD-10-CM

## 2018-11-27 DIAGNOSIS — I129 Hypertensive chronic kidney disease with stage 1 through stage 4 chronic kidney disease, or unspecified chronic kidney disease: Secondary | ICD-10-CM | POA: Diagnosis not present

## 2018-11-27 MED ORDER — HYDROCHLOROTHIAZIDE 12.5 MG PO CAPS: 12.5000 mg | ORAL_CAPSULE | Freq: Every day | ORAL | 1 refills | Status: DC

## 2018-11-27 NOTE — Progress Notes (Signed)
Subjective:     Patient ID: Jasmine Harrington , female    DOB: [birth year] , 63 y.o.   MRN: [id number]   Chief Complaint  Patient presents with  . Hypertension    HPI  She is here today for bp check. She reports she ran out of her HCTZ a week or so ago. She has not heard from her mail order pharmacy.   Hypertension This is a chronic problem. The current episode started more than 1 year ago. The problem has been gradually improving since onset. The problem is controlled. Pertinent negatives include no blurred vision, chest pain, palpitations or shortness of breath. Past treatments include angiotensin blockers and diuretics. The current treatment provides moderate improvement. Compliance problems include exercise.  Hypertensive end-organ damage includes kidney disease.     Past Medical History:  Diagnosis Date  . Arthritis   . Hypertension   . Pneumonia   . Tuberculosis    pt was pos for tb . had to take 6 months of meds .      Family History  Problem Relation Age of Onset  . Hypertension Paternal Grandfather   . Hypertension Paternal Grandmother   . Hypertension Maternal Grandmother   . Hypertension Maternal Grandfather   . Prostate cancer Father   . Breast cancer Mother   . Leukemia Brother      Current Outpatient Medications:  .  albuterol (PROVENTIL HFA;VENTOLIN HFA) 108 (90 Base) MCG/ACT inhaler, Inhale 2 puffs into the lungs every 6 (six) hours as needed for wheezing or shortness of breath., Disp: 1 Inhaler, Rfl: 2 .  azelastine (ASTELIN) 0.1 % nasal spray, Place 1 spray into both nostrils 2 (two) times daily. Use in each nostril as directed, Disp: 90 mL, Rfl: 3 .  cetirizine (ZYRTEC) 10 MG tablet, Take 1 tablet (10 mg total) by mouth daily., Disp: 90 tablet, Rfl: 2 .  cholecalciferol (VITAMIN D) 1000 UNITS tablet, Take 1,000 Units by mouth 4 (four) times daily., Disp: , Rfl:  .  hydrochlorothiazide (MICROZIDE) 12.5 MG capsule, Take 1 capsule (12.5 mg total) by mouth daily.,  Disp: 90 capsule, Rfl: 1 .  olmesartan (BENICAR) 40 MG tablet, Take 1 tablet (40 mg total) by mouth daily., Disp: 90 tablet, Rfl: 1 .  omega-3 acid ethyl esters (LOVAZA) 1 G capsule, Take 2 g by mouth 2 (two) times daily., Disp: , Rfl:  .  VITAMIN E PO, Take by mouth., Disp: , Rfl:    Allergies  Allergen Reactions  . Aspirin     rash  . Acetomenaphthone (Menadiol Diacetate) [Menadiol Sodium Diphosphate] Hives and Rash  . Penicillins Rash     Review of Systems  Constitutional: Negative.   Eyes: Negative for blurred vision.  Respiratory: Negative.  Negative for shortness of breath.   Cardiovascular: Negative.  Negative for chest pain and palpitations.  Gastrointestinal: Negative.   Neurological: Negative.   Psychiatric/Behavioral: Negative.      Today's Vitals   11/27/18 0942  BP: (!) 144/82  Pulse: 69  Temp: 98 F (36.7 C)  TempSrc: Oral  SpO2: 97%  Weight: 246 lb 9.6 oz (111.9 kg)  Height: 5' 0.4" (1.534 m)   Body mass index is 47.52 kg/m.   Objective:  Physical Exam Vitals signs and nursing note reviewed.  Constitutional:      Appearance: Normal appearance.  HENT:     Head: Normocephalic and atraumatic.  Cardiovascular:     Rate and Rhythm: Normal rate and regular rhythm.  Heart sounds: Normal heart sounds.  Pulmonary:     Effort: Pulmonary effort is normal.     Breath sounds: Normal breath sounds.  Skin:    General: Skin is warm.  Neurological:     General: No focal deficit present.     Mental Status: She is alert.  Psychiatric:        Mood and Affect: Mood normal.        Behavior: Behavior normal.         Assessment And Plan:     1. Hypertensive nephropathy  Chronic, fair control. She will continue with current meds. Refill sent for hctz to local pharmacy. She will rto in four months for re-evaluation.   - TSH - BMP8+EGFR  2. Chronic renal disease, stage II  I DISCUSSED THE VARIOUS STAGES OF CHRONIC KIDNEY DISEASE AND THEIR CURRENT STAGE.   ALSO DISCUSSED IMPORTANCE OF BP CONTROL AND ADEQUATE HYDRATION.  3. Need for influenza vaccination  - Flu Vaccine QUAD 6+ mos PF IM (Fluarix Quad PF)  4. Class 3 severe obesity due to excess calories with serious comorbidity and body mass index (BMI) of 45.0 to 49.9 in adult [facility])  Importance of achieving optimal weight to decrease risk of cardiovascular disease and cancers was discussed with the patient in full detail. Importance of regular exercise was stressed to the patient. She is encouraged to start slowly - start with 10 minutes twice daily at least three to four days per week and to gradually build to 30 minutes five days weekly. She was given tips to incorporate more activity into her daily routine - take stairs when possible, park farther away from grocery stores, etc.    [name], MD    THE PATIENT IS ENCOURAGED TO PRACTICE SOCIAL DISTANCING DUE TO THE COVID-19 PANDEMIC.

## 2018-11-27 NOTE — Patient Instructions (Addendum)
Exercising to Lose Weight Exercise is structured, repetitive physical activity to improve fitness and health. Getting regular exercise is important for everyone. It is especially important if you are overweight. Being overweight increases your risk of heart disease, stroke, diabetes, high blood pressure, and several types of cancer. Reducing your calorie intake and exercising can help you lose weight. Exercise is usually categorized as moderate or vigorous intensity. To lose weight, most people need to do a certain amount of moderate-intensity or vigorous-intensity exercise each week. Moderate-intensity exercise  Moderate-intensity exercise is any activity that gets you moving enough to burn at least three times more energy (calories) than if you were sitting. Examples of moderate exercise include:  Walking a mile in 15 minutes.  Doing light yard work.  Biking at an easy pace. Most people should get at least 150 minutes (2 hours and 30 minutes) a week of moderate-intensity exercise to maintain their body weight. Vigorous-intensity exercise Vigorous-intensity exercise is any activity that gets you moving enough to burn at least six times more calories than if you were sitting. When you exercise at this intensity, you should be working hard enough that you are not able to carry on a conversation. Examples of vigorous exercise include:  Running.  Playing a team sport, such as football, basketball, and soccer.  Jumping rope. Most people should get at least 75 minutes (1 hour and 15 minutes) a week of vigorous-intensity exercise to maintain their body weight. How can exercise affect me? When you exercise enough to burn more calories than you eat, you lose weight. Exercise also reduces body fat and builds muscle. The more muscle you have, the more calories you burn. Exercise also:  Improves mood.  Reduces stress and tension.  Improves your overall fitness, flexibility, and endurance.   Increases bone strength. The amount of exercise you need to lose weight depends on:  Your age.  The type of exercise.  Any health conditions you have.  Your overall physical ability. Talk to your health care provider about how much exercise you need and what types of activities are safe for you. What actions can I take to lose weight? Nutrition   Make changes to your diet as told by your health care provider or diet and nutrition specialist (dietitian). This may include: ? Eating fewer calories. ? Eating more protein. ? Eating less unhealthy fats. ? Eating a diet that includes fresh fruits and vegetables, whole grains, low-fat dairy products, and lean protein. ? Avoiding foods with added fat, salt, and sugar.  Drink plenty of water while you exercise to prevent dehydration or heat stroke. Activity  Choose an activity that you enjoy and set realistic goals. Your health care provider can help you make an exercise plan that works for you.  Exercise at a moderate or vigorous intensity most days of the week. ? The intensity of exercise may vary from person to person. You can tell how intense a workout is for you by paying attention to your breathing and heartbeat. Most people will notice their breathing and heartbeat get faster with more intense exercise.  Do resistance training twice each week, such as: ? Push-ups. ? Sit-ups. ? Lifting weights. ? Using resistance bands.  Getting short amounts of exercise can be just as helpful as long structured periods of exercise. If you have trouble finding time to exercise, try to include exercise in your daily routine. ? Get up, stretch, and walk around every 30 minutes throughout the day. ? Go for a   walk during your lunch break. ? Park your car farther away from your destination. ? If you take public transportation, get off one stop early and walk the rest of the way. ? Make phone calls while standing up and walking around. ? Take the  stairs instead of elevators or escalators.  Wear comfortable clothes and shoes with good support.  Do not exercise so much that you hurt yourself, feel dizzy, or get very short of breath. Where to find more information  [place] Department of Health and Human Services: [employer]  Centers for Disease Control and Prevention (CDC): http://www.wolf.info/ Contact a health care provider:  Before starting a new exercise program.  If you have questions or concerns about your weight.  If you have a medical problem that keeps you from exercising. Get help right away if you have any of the following while exercising:  Injury.  Dizziness.  Difficulty breathing or shortness of breath that does not go away when you stop exercising.  Chest pain.  Rapid heartbeat. Summary  Being overweight increases your risk of heart disease, stroke, diabetes, high blood pressure, and several types of cancer.  Losing weight happens when you burn more calories than you eat.  Reducing the amount of calories you eat in addition to getting regular moderate or vigorous exercise each week helps you lose weight. This information is not intended to replace advice given to you by your health care provider. Make sure you discuss any questions you have with your health care provider. Document Released: 03/20/2010 Document Revised: 02/28/2017 Document Reviewed: 02/28/2017 Elsevier Patient Education  2020 [place].   Chronic Kidney Disease, Adult Chronic kidney disease (CKD) happens when the kidneys are damaged over a long period of time. The kidneys are two organs that help with:  Getting rid of waste and extra fluid from the blood.  Making hormones that maintain the amount of fluid in your tissues and blood vessels.  Making sure that the body has the right amount of fluids and chemicals. Most of the time, CKD does not go away, but it can usually be controlled. Steps must be taken to slow down the kidney damage or to  stop it from getting worse. If this is not done, the kidneys may stop working. Follow these instructions at home: Medicines  Take over-the-counter and prescription medicines only as told by your doctor. You may need to change the amount of medicines you take.  Do not take any new medicines unless your doctor says it is okay. Many medicines can make your kidney damage worse.  Do not take any vitamin and supplements unless your doctor says it is okay. Many vitamins and supplements can make your kidney damage worse. General instructions  Follow a diet as told by your doctor. You may need to stay away from: ? Alcohol. ? Salty foods. ? Foods that are high in:  Potassium.  Calcium.  Protein.  Do not use any products that contain nicotine or tobacco, such as cigarettes and e-cigarettes. If you need help quitting, ask your doctor.  Keep track of your blood pressure at home. Tell your doctor about any changes.  If you have diabetes, keep track of your blood sugar as told by your doctor.  Try to stay at a healthy weight. If you need help, ask your doctor.  Exercise at least 30 minutes a day, 5 days a week.  Stay up-to-date with your shots (immunizations) as told by your doctor.  Keep all follow-up visits as told  by your doctor. This is important. Contact a doctor if:  Your symptoms get worse.  You have new symptoms. Get help right away if:  You have symptoms of end-stage kidney disease. These may include: ? Headaches. ? Numbness in your hands or feet. ? Easy bruising. ? Having hiccups often. ? Chest pain. ? Shortness of breath. ? Stopping of menstrual periods in women.  You have a fever.  You have very little pee (urine).  You have pain or bleeding when you pee. Summary  Chronic kidney disease (CKD) happens when the kidneys are damaged over a long period of time.  Most of the time, this condition does not go away, but it can usually be controlled. Steps must be taken  to slow down the kidney damage or to stop it from getting worse.  Treatment may include a combination of medicines and lifestyle changes. This information is not intended to replace advice given to you by your health care provider. Make sure you discuss any questions you have with your health care provider. Document Released: 05/12/2009 Document Revised: 01/28/2017 Document Reviewed: 03/22/2016 Elsevier Patient Education  2020 [employer].

## 2018-11-28 ENCOUNTER — Telehealth: Payer: Self-pay

## 2018-11-28 LAB — BMP8+EGFR
BUN/Creatinine Ratio: 18 (ref 12–28)
BUN: 16 mg/dL (ref 8–27)
CO2: 22 mmol/L (ref 20–29)
Calcium: 9.5 mg/dL (ref 8.7–10.3)
Chloride: 107 mmol/L — ABNORMAL HIGH (ref 96–106)
Creatinine, Ser: 0.91 mg/dL (ref 0.57–1.00)
GFR calc Af Amer: 78 mL/min/{1.73_m2} (ref >59)
GFR calc non Af Amer: 68 mL/min/{1.73_m2} (ref >59)
Glucose: 99 mg/dL (ref 65–99)
Potassium: 4.1 mmol/L (ref 3.5–5.2)
Sodium: 142 mmol/L (ref 134–144)

## 2018-11-28 LAB — TSH: TSH: 1.33 u[IU]/mL (ref 0.450–4.500)

## 2018-11-28 NOTE — Telephone Encounter (Signed)
1st attempt to give results 

## 2018-11-28 NOTE — Telephone Encounter (Signed)
-----   Message from Glendale Chard, MD sent at 11/28/2018  8:44 AM EDT ----- Your thyroid function is normal. Your kidney function is stable. However, your chloride level is elevated which implies you need to increase your water intake.

## 2018-11-29 ENCOUNTER — Other Ambulatory Visit: Payer: Self-pay

## 2018-11-29 MED ORDER — CETIRIZINE HCL 10 MG PO TABS: 10.0000 mg | ORAL_TABLET | Freq: Every day | ORAL | 2 refills | Status: DC

## 2018-12-22 ENCOUNTER — Encounter (HOSPITAL_COMMUNITY): Payer: Self-pay

## 2018-12-22 ENCOUNTER — Ambulatory Visit (HOSPITAL_COMMUNITY)
Admission: EM | Admit: 2018-12-22 | Discharge: 2018-12-22 | Disposition: A | Discharge status: Home / Self Care | Attending: Emergency Medicine | Admitting: Emergency Medicine

## 2018-12-22 ENCOUNTER — Other Ambulatory Visit: Payer: Self-pay

## 2018-12-22 DIAGNOSIS — M7522 Bicipital tendinitis, left shoulder: Secondary | ICD-10-CM | POA: Diagnosis not present

## 2018-12-22 MED ORDER — DICLOFENAC SODIUM 1 % TD GEL: 1.0000 "application " | Freq: Four times a day (QID) | TRANSDERMAL | 0 refills | Status: DC

## 2018-12-22 MED ORDER — METHYLPREDNISOLONE 4 MG PO TBPK: ORAL_TABLET | Freq: Every day | ORAL | 0 refills | Status: DC

## 2018-12-22 NOTE — ED Triage Notes (Signed)
Patient presents to Urgent Care with complaints of left shoulder pain since yesterday. 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

## 2018-12-22 NOTE — Discharge Instructions (Addendum)
1 g of Tylenol 3 or 4 times a day as needed for pain.  You can use Voltaren gel for for 5 days.  I would not use it for any longer than that.  Use it as sparingly as possible.  Finish the Medrol Dosepak, ice for 20 minutes at a time.  Rest as much as possible.  Follow-up with orthopedics in 2 weeks if not better.

## 2018-12-22 NOTE — ED Provider Notes (Signed)
HPI  SUBJECTIVE:  Jasmine Harrington is a 63 y.o. female who presents with left anterior shoulder pain starting yesterday.  She describes it as a constant soreness.  It does not migrate, radiate up her neck or down her arm.  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  No trauma.  No numbness or tingling in her arm.  No weakness.  No fevers.  No swelling, erythema of the shoulder.  No chest pain, shortness of breath.  No nausea, diaphoresis, exertional component.  She reports limitation of motion secondary to pain.  She tried ice with improvement in her symptoms.  Symptoms worse with abduction, forward flexion across her chest and palpation.  She has a past medical history of hypertension, chronic kidney disease stage II, remote history of left shoulder dislocation.  No history of diabetes, coronary disease, MI, hypercholesterolemia.  [id number], Bailey Mech, MD   Past Medical History:  Diagnosis Date  . Arthritis   . Hypertension   . Pneumonia   . Tuberculosis    pt was pos for tb . had to take 6 months of meds .     Past Surgical History:  Procedure Laterality Date  . ABDOMINAL HYSTERECTOMY  2008  . COLONOSCOPY  10/2017  . KNEE SURGERY Right 1965    Family History  Problem Relation Age of Onset  . Hypertension Paternal Grandfather   . Hypertension Paternal Grandmother   . Hypertension Maternal Grandmother   . Hypertension Maternal Grandfather   . Prostate cancer Father   . Breast cancer Mother   . Leukemia Brother     Social History   Tobacco Use  . Smoking status: Never Smoker  . Smokeless tobacco: Never Used  Substance Use Topics  . Alcohol use: No  . Drug use: No    No current facility-administered medications for this encounter.   Current Outpatient Medications:  .  hydrochlorothiazide (MICROZIDE) 12.5 MG capsule, Take 1 capsule (12.5 mg total) by mouth daily., Disp: 90 capsule, Rfl: 1 .  olmesartan (BENICAR) 40 MG tablet, Take 1 tablet  (40 mg total) by mouth daily., Disp: 90 tablet, Rfl: 1 .  albuterol (PROVENTIL HFA;VENTOLIN HFA) 108 (90 Base) MCG/ACT inhaler, Inhale 2 puffs into the lungs every 6 (six) hours as needed for wheezing or shortness of breath., Disp: 1 Inhaler, Rfl: 2 .  azelastine (ASTELIN) 0.1 % nasal spray, Place 1 spray into both nostrils 2 (two) times daily. Use in each nostril as directed, Disp: 90 mL, Rfl: 3 .  cetirizine (ZYRTEC) 10 MG tablet, Take 1 tablet (10 mg total) by mouth daily., Disp: 90 tablet, Rfl: 2 .  cholecalciferol (VITAMIN D) 1000 UNITS tablet, Take 1,000 Units by mouth 4 (four) times daily., Disp: , Rfl:  .  diclofenac sodium (VOLTAREN) 1 % GEL, Apply 1 application topically 4 (four) times daily., Disp: 100 g, Rfl: 0 .  methylPREDNISolone (MEDROL DOSEPAK) 4 MG TBPK tablet, Take by mouth daily. Follow package instructions, Disp: 21 tablet, Rfl: 0 .  omega-3 acid ethyl esters (LOVAZA) 1 G capsule, Take 2 g by mouth 2 (two) times daily., Disp: , Rfl:  .  VITAMIN E PO, Take by mouth., Disp: , Rfl:   Allergies  Allergen Reactions  . Aspirin     rash  . Penicillins Rash     ROS  As noted in HPI.   Physical Exam  BP 126/84 (BP Location: Right Arm)   Pulse 78   Temp 99.4  F (37.4 C) (Oral)   Resp 16   SpO2 99%   Constitutional: Well developed, well nourished, no acute distress Eyes:  EOMI, conjunctiva normal bilaterally HENT: Normocephalic, atraumatic,mucus membranes moist Respiratory: Normal inspiratory effort Cardiovascular: Normal rate GI: nondistended skin: No rash, skin intact Musculoskeletal:  L shoulder with somewhat limited , pain aggravated with abduction to 90 degrees, forward flexion across the torso.  Drop test painful but negative,  clavicle NT, A/C joint NT, proximal humerus NT, Motor strength normal , Sensation intact LT over deltoid region, distal NVI with hand on affected side having grossly intact sensation and strength in the distribution of the median, radial,  and ulnar nerve.  Pain with internal rotation, no pain with external rotation, Positive exquisite tenderness in bicipital groove, negative empty can test,  negative liftoff test, negative "popeye" sign, no instability with abduction/external rotation. Neurologic: Alert & oriented x 3, no focal neuro deficits Psychiatric: Speech and behavior appropriate   ED Course   Medications - No data to display  No orders of the defined types were placed in this encounter.   No results found for this or any previous visit (from the past 24 hour(s)). No results found.  ED Clinical Impression  1. Biceps tendinitis of left upper extremity      ED Assessment/Plan  Presentation consistent with a biceps tendinitis.  No evidence of biceps tendon rupture although this is in the differential.  Do not think that we need imaging today.  Doubt cardiac cause.  Labs reviewed.  Kidney function in May, September of this year normal.  Feel that a short course of Voltaren gel would be beneficial.  Also, 1 g of Tylenol 3-4 times a day.  Medrol Dosepak.  Continue ice for 20 minutes at a time.  Rest.  Orthopedics in 2 weeks if not better with conservative treatment.  Dr. Rennis Chris or Dr. Susa Simmonds.  Discussed labs, MDM, treatment plan, and plan for follow-up with patient. patient agrees with plan.   Meds ordered this encounter  Medications  . methylPREDNISolone (MEDROL DOSEPAK) 4 MG TBPK tablet    Sig: Take by mouth daily. Follow package instructions    Dispense:  21 tablet    Refill:  0  . diclofenac sodium (VOLTAREN) 1 % GEL    Sig: Apply 1 application topically 4 (four) times daily.    Dispense:  100 g    Refill:  0    *This clinic note was created using Scientist, clinical (histocompatibility and immunogenetics). Therefore, there may be occasional mistakes despite careful proofreading.   ?   Domenick Gong, MD 12/22/18 2036

## 2019-03-26 ENCOUNTER — Other Ambulatory Visit: Payer: Self-pay

## 2019-03-26 ENCOUNTER — Ambulatory Visit (INDEPENDENT_AMBULATORY_CARE_PROVIDER_SITE_OTHER): Admitting: Internal Medicine

## 2019-03-26 ENCOUNTER — Encounter: Payer: Self-pay | Admitting: Internal Medicine

## 2019-03-26 VITALS — BP 138/76 | HR 65 | Temp 98.3°F | Ht 61.4 in | Wt 248.0 lb

## 2019-03-26 DIAGNOSIS — Z23 Encounter for immunization: Secondary | ICD-10-CM

## 2019-03-26 DIAGNOSIS — I129 Hypertensive chronic kidney disease with stage 1 through stage 4 chronic kidney disease, or unspecified chronic kidney disease: Secondary | ICD-10-CM | POA: Diagnosis not present

## 2019-03-26 DIAGNOSIS — Z6841 Body Mass Index (BMI) 40.0 and over, adult: Secondary | ICD-10-CM

## 2019-03-26 DIAGNOSIS — E66813 Obesity, class 3: Secondary | ICD-10-CM | POA: Insufficient documentation

## 2019-03-26 DIAGNOSIS — N182 Chronic kidney disease, stage 2 (mild): Secondary | ICD-10-CM

## 2019-03-26 DIAGNOSIS — E559 Vitamin D deficiency, unspecified: Secondary | ICD-10-CM

## 2019-03-26 MED ORDER — SHINGRIX 50 MCG/0.5ML IM SUSR: 0.5000 mL | Freq: Once | INTRAMUSCULAR | 0 refills | Status: DC

## 2019-03-26 MED ORDER — SHINGRIX 50 MCG/0.5ML IM SUSR: 0.5000 mL | Freq: Once | INTRAMUSCULAR | 1 refills | Status: AC

## 2019-03-26 NOTE — Progress Notes (Signed)
This visit occurred during the SARS-CoV-2 public health emergency.  Safety protocols were in place, including screening questions prior to the visit, additional usage of staff PPE, and extensive cleaning of exam room while observing appropriate contact time as indicated for disinfecting solutions.  Subjective:     Patient ID: Jasmine Harrington , female    DOB: 1955-05-14 , 64 y.o.   MRN: [id number]   Chief Complaint  Patient presents with  . Hypertension  . Immunizations    shingles    HPI  She presents today for BP check. She reports compliance with meds. She admits she is not exercising on a regular basis. 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   Hypertension This is a chronic problem. The current episode started more than 1 year ago. The problem has been gradually improving since onset. The problem is controlled. Pertinent negatives include no blurred vision, chest pain, palpitations or shortness of breath. Past treatments include angiotensin blockers and diuretics. The current treatment provides moderate improvement. Compliance problems include exercise.  Hypertensive end-organ damage includes kidney disease.     Past Medical History:  Diagnosis Date  . Arthritis   . Hypertension   . Pneumonia   . Tuberculosis    pt was pos for tb . had to take 6 months of meds .      Family History  Problem Relation Age of Onset  . Hypertension Paternal Grandfather   . Hypertension Paternal Grandmother   . Hypertension Maternal Grandmother   . Hypertension Maternal Grandfather   . Prostate cancer Father   . Breast cancer Mother   . Leukemia Brother      Current Outpatient Medications:  .  albuterol (PROVENTIL HFA;VENTOLIN HFA) 108 (90 Base) MCG/ACT inhaler, Inhale 2 puffs into the lungs every 6 (six) hours as needed for wheezing or shortness of breath., Disp: 1 Inhaler, Rfl: 2 .  azelastine (ASTELIN) 0.1 % nasal  spray, Place 1 spray into both nostrils 2 (two) times daily. Use in each nostril as directed, Disp: 90 mL, Rfl: 3 .  cetirizine (ZYRTEC) 10 MG tablet, Take 1 tablet (10 mg total) by mouth daily., Disp: 90 tablet, Rfl: 2 .  cholecalciferol (VITAMIN D) 1000 UNITS tablet, Take 1,000 Units by mouth daily. , Disp: , Rfl:  .  diclofenac sodium (VOLTAREN) 1 % GEL, Apply 1 application topically 4 (four) times daily., Disp: 100 g, Rfl: 0 .  hydrochlorothiazide (MICROZIDE) 12.5 MG capsule, Take 1 capsule (12.5 mg total) by mouth daily., Disp: 90 capsule, Rfl: 1 .  olmesartan (BENICAR) 40 MG tablet, Take 1 tablet (40 mg total) by mouth daily., Disp: 90 tablet, Rfl: 1 .  omega-3 acid ethyl esters (LOVAZA) 1 G capsule, Take 2 g by mouth 2 (two) times daily., Disp: , Rfl:  .  VITAMIN E PO, Take by mouth., Disp: , Rfl:  .  Zoster Vaccine Adjuvanted [facility]) injection, Inject 0.5 mLs into the muscle once for 1 dose., Disp: 0.5 mL, Rfl: 1   Allergies  Allergen Reactions  . Aspirin     rash  . Penicillins Rash     Review of Systems  Constitutional: Negative.   Eyes: Negative for blurred vision.  Respiratory: Negative.  Negative for shortness of breath.   Cardiovascular: Negative.  Negative for chest pain and palpitations.  Gastrointestinal: Negative.   Neurological: Negative.   Psychiatric/Behavioral: Negative.      Today's  Vitals   03/26/19 0959  BP: 138/76  Pulse: 65  Temp: 98.3 F (36.8 C)  TempSrc: Oral  Weight: 248 lb (112.5 kg)  Height: 5' 1.4" (1.56 m)   Body mass index is 46.25 kg/m.   Objective:  Physical Exam Vitals and nursing note reviewed.  Constitutional:      Appearance: Normal appearance. She is obese.  HENT:     Head: Normocephalic and atraumatic.     Nose:     Comments: Deferred, masked    Mouth/Throat:     Comments: Deferred, masked Cardiovascular:     Rate and Rhythm: Normal rate and regular rhythm.     Heart sounds: Normal heart sounds.  Pulmonary:      Effort: Pulmonary effort is normal.     Breath sounds: Normal breath sounds.  Skin:    General: Skin is warm.  Neurological:     General: No focal deficit present.     Mental Status: She is alert.  Psychiatric:        Mood and Affect: Mood normal.        Behavior: Behavior normal.         Assessment And Plan:     1. Hypertensive nephropathy  Chronic, fair control. She will continue with current meds. Importance of regular exercise was discussed with the patient. She is also encouraged to avoid adding salt to her foods.   2. Chronic renal disease, stage II  Chronic, yet stable. Last renal function results were reviewed.   3. Immunization due  She was given rx Shingrix sent to her local pharmacy. She is encouraged to notify me once this has been administered.   4. Class 3 severe obesity due to excess calories with serious comorbidity and body mass index (BMI) of 45.0 to 49.9 in adult (HCC)  BMI 46. She is encouraged to incorporate more exercise into her daily routine. She is advised to aim for at least 150 minutes per week. Also encouraged to avoid sugary beverages.   5. Vitamin D deficiency disease  I WILL CHECK A VIT D LEVEL AND SUPPLEMENT AS NEEDED.  ALSO ENCOURAGED TO SPEND 15 MINUTES IN THE SUN DAILY.  - Vitamin D (25 hydroxy)   Gwynneth Aliment, MD    THE PATIENT IS ENCOURAGED TO PRACTICE SOCIAL DISTANCING DUE TO THE COVID-19 PANDEMIC.

## 2019-03-26 NOTE — Patient Instructions (Signed)

## 2019-03-27 LAB — VITAMIN D 25 HYDROXY (VIT D DEFICIENCY, FRACTURES): Vit D, 25-Hydroxy: 59.4 ng/mL (ref 30.0–100.0)

## 2019-03-29 ENCOUNTER — Telehealth: Payer: Self-pay

## 2019-03-29 ENCOUNTER — Ambulatory Visit: Admitting: Internal Medicine

## 2019-03-29 NOTE — Telephone Encounter (Signed)
-----   Message from Dorothyann Peng, MD sent at 03/27/2019 10:34 PM EST ----- Vit d level is 59, not bad! Continue with current vit d supplementation. Pls confirm dose she is taking.

## 2019-03-29 NOTE — Telephone Encounter (Signed)
Left vm for pt to return call for labs  

## 2019-04-03 NOTE — Progress Notes (Signed)
Mailed letter with lab results.   

## 2019-05-28 ENCOUNTER — Other Ambulatory Visit: Payer: Self-pay

## 2019-05-28 MED ORDER — OLMESARTAN MEDOXOMIL 40 MG PO TABS: 40.0000 mg | ORAL_TABLET | Freq: Every day | ORAL | 1 refills | Status: DC

## 2019-07-26 ENCOUNTER — Encounter: Admitting: Internal Medicine

## 2019-07-31 ENCOUNTER — Telehealth: Payer: Self-pay

## 2019-07-31 NOTE — Telephone Encounter (Signed)
PT CALLED TO VERIFY APPT BUT ADVISED PT ABOUT [employer] AND THEY MAY NOT COVER HER PHYSICAL. PT CALLED AND SPOKE W/REP AT [employer] WHICH ADV HER THAT THEY TAKE CARE OF EVERYTHING AND THEY WILL COVER HER PHYSICAL. ADV PT THAT IF ANY FEES INCUR IT WILL BE HER RESPONSIBILITY

## 2019-08-06 ENCOUNTER — Encounter: Payer: Self-pay | Admitting: Internal Medicine

## 2019-08-06 ENCOUNTER — Ambulatory Visit (INDEPENDENT_AMBULATORY_CARE_PROVIDER_SITE_OTHER): Admitting: Internal Medicine

## 2019-08-06 ENCOUNTER — Other Ambulatory Visit: Payer: Self-pay

## 2019-08-06 VITALS — BP 130/76 | HR 75 | Temp 98.0°F | Ht 62.2 in | Wt 255.4 lb

## 2019-08-06 DIAGNOSIS — I10 Essential (primary) hypertension: Secondary | ICD-10-CM

## 2019-08-06 DIAGNOSIS — Z0000 Encounter for general adult medical examination without abnormal findings: Secondary | ICD-10-CM

## 2019-08-06 LAB — POCT URINALYSIS DIPSTICK
Bilirubin, UA: NEGATIVE
Blood, UA: NEGATIVE
Glucose, UA: NEGATIVE
Ketones, UA: NEGATIVE
Leukocytes, UA: NEGATIVE
Nitrite, UA: NEGATIVE
Protein, UA: NEGATIVE
Spec Grav, UA: 1.025 (ref 1.010–1.025)
Urobilinogen, UA: 0.2 E.U./dL
pH, UA: 5 (ref 5.0–8.0)

## 2019-08-06 LAB — POCT UA - MICROALBUMIN
Albumin/Creatinine Ratio, Urine, POC: <30
Creatinine, POC: 100 mg/dL
Microalbumin Ur, POC: 10 mg/L

## 2019-08-06 NOTE — Patient Instructions (Signed)
Health Maintenance, Female Adopting a healthy lifestyle and getting preventive care are important in promoting health and wellness. Ask your health care provider about:  The right schedule for you to have regular tests and exams.  Things you can do on your own to prevent diseases and keep yourself healthy. What should I know about diet, weight, and exercise? Eat a healthy diet   Eat a diet that includes plenty of vegetables, fruits, low-fat dairy products, and lean protein.  Do not eat a lot of foods that are high in solid fats, added sugars, or sodium. Maintain a healthy weight Body mass index (BMI) is used to identify weight problems. It estimates body fat based on height and weight. Your health care provider can help determine your BMI and help you achieve or maintain a healthy weight. Get regular exercise Get regular exercise. This is one of the most important things you can do for your health. Most adults should:  Exercise for at least 150 minutes each week. The exercise should increase your heart rate and make you sweat (moderate-intensity exercise).  Do strengthening exercises at least twice a week. This is in addition to the moderate-intensity exercise.  Spend less time sitting. Even light physical activity can be beneficial. Watch cholesterol and blood lipids Have your blood tested for lipids and cholesterol at 63 years of age, then have this test every 5 years. Have your cholesterol levels checked more often if:  Your lipid or cholesterol levels are high.  You are older than 63 years of age.  You are at high risk for heart disease. What should I know about cancer screening? Depending on your health history and family history, you may need to have cancer screening at various ages. This may include screening for:  Breast cancer.  Cervical cancer.  Colorectal cancer.  Skin cancer.  Lung cancer. What should I know about heart disease, diabetes, and high blood  pressure? Blood pressure and heart disease  High blood pressure causes heart disease and increases the risk of stroke. This is more likely to develop in people who have high blood pressure readings, are of African descent, or are overweight.  Have your blood pressure checked: ? Every 3-5 years if you are 18-39 years of age. ? Every year if you are 40 years old or older. Diabetes Have regular diabetes screenings. This checks your fasting blood sugar level. Have the screening done:  Once every three years after age 40 if you are at a normal weight and have a low risk for diabetes.  More often and at a younger age if you are overweight or have a high risk for diabetes. What should I know about preventing infection? Hepatitis B If you have a higher risk for hepatitis B, you should be screened for this virus. Talk with your health care provider to find out if you are at risk for hepatitis B infection. Hepatitis C Testing is recommended for:  Everyone born from 1945 through 1965.  Anyone with known risk factors for hepatitis C. Sexually transmitted infections (STIs)  Get screened for STIs, including gonorrhea and chlamydia, if: ? You are sexually active and are younger than 63 years of age. ? You are older than 63 years of age and your health care provider tells you that you are at risk for this type of infection. ? Your sexual activity has changed since you were last screened, and you are at increased risk for chlamydia or gonorrhea. Ask your health care provider if   you are at risk.  Ask your health care provider about whether you are at high risk for HIV. Your health care provider may recommend a prescription medicine to help prevent HIV infection. If you choose to take medicine to prevent HIV, you should first get tested for HIV. You should then be tested every 3 months for as long as you are taking the medicine. Pregnancy  If you are about to stop having your period (premenopausal) and  you may become pregnant, seek counseling before you get pregnant.  Take 400 to 800 micrograms (mcg) of folic acid every day if you become pregnant.  Ask for birth control (contraception) if you want to prevent pregnancy. Osteoporosis and menopause Osteoporosis is a disease in which the bones lose minerals and strength with aging. This can result in bone fractures. If you are 65 years old or older, or if you are at risk for osteoporosis and fractures, ask your health care provider if you should:  Be screened for bone loss.  Take a calcium or vitamin D supplement to lower your risk of fractures.  Be given hormone replacement therapy (HRT) to treat symptoms of menopause. Follow these instructions at home: Lifestyle  Do not use any products that contain nicotine or tobacco, such as cigarettes, e-cigarettes, and chewing tobacco. If you need help quitting, ask your health care provider.  Do not use street drugs.  Do not share needles.  Ask your health care provider for help if you need support or information about quitting drugs. Alcohol use  Do not drink alcohol if: ? Your health care provider tells you not to drink. ? You are pregnant, may be pregnant, or are planning to become pregnant.  If you drink alcohol: ? Limit how much you use to 0-1 drink a day. ? Limit intake if you are breastfeeding.  Be aware of how much alcohol is in your drink. In the [place] one drink equals one 12 oz bottle of beer (355 mL), one 5 oz glass of wine (148 mL), or one 1 oz glass of hard liquor (44 mL). General instructions  Schedule regular health, dental, and eye exams.  Stay current with your vaccines.  Tell your health care provider if: ? You often feel depressed. ? You have ever been abused or do not feel safe at home. Summary  Adopting a healthy lifestyle and getting preventive care are important in promoting health and wellness.  Follow your health care provider's instructions about healthy  diet, exercising, and getting tested or screened for diseases.  Follow your health care provider's instructions on monitoring your cholesterol and blood pressure. This information is not intended to replace advice given to you by your health care provider. Make sure you discuss any questions you have with your health care provider. Document Revised: 02/08/2018 Document Reviewed: 02/08/2018 [employer] Patient Education  2020 [employer]  

## 2019-08-06 NOTE — Progress Notes (Signed)
This visit occurred during the SARS-CoV-2 public health emergency.  Safety protocols were in place, including screening questions prior to the visit, additional usage of staff PPE, and extensive cleaning of exam room while observing appropriate contact time as indicated for disinfecting solutions.  Subjective:     Patient ID: Jasmine Harrington , female    DOB: January 12, 1956 , 64 y.o.   MRN: [id number]   Chief Complaint  Patient presents with  . Annual Exam  . Hypertension    HPI  She is here today for a full physical exam. She is followed by Dr. Cletis Media for her GYN care. She has no specific concerns or complaints at this time.   Hypertension This is a chronic problem. The current episode started more than 1 year ago. The problem has been gradually improving since onset. The problem is controlled. Pertinent negatives include no blurred vision, chest pain, palpitations or shortness of breath. Risk factors for coronary artery disease include obesity, sedentary lifestyle and post-menopausal state.     Past Medical History:  Diagnosis Date  . Arthritis   . Hypertension   . Pneumonia   . Tuberculosis    pt was pos for tb . had to take 6 months of meds .      Family History  Problem Relation Age of Onset  . Hypertension Paternal Grandfather   . Hypertension Paternal Grandmother   . Hypertension Maternal Grandmother   . Hypertension Maternal Grandfather   . Prostate cancer Father   . Breast cancer Mother   . Leukemia Brother      Current Outpatient Medications:  .  albuterol (PROVENTIL HFA;VENTOLIN HFA) 108 (90 Base) MCG/ACT inhaler, Inhale 2 puffs into the lungs every 6 (six) hours as needed for wheezing or shortness of breath., Disp: 1 Inhaler, Rfl: 2 .  azelastine (ASTELIN) 0.1 % nasal spray, Place 1 spray into both nostrils 2 (two) times daily. Use in each nostril as directed, Disp: 90 mL, Rfl: 3 .  cetirizine (ZYRTEC) 10 MG tablet, Take 1 tablet (10 mg total) by mouth daily., Disp: 90  tablet, Rfl: 2 .  cholecalciferol (VITAMIN D) 1000 UNITS tablet, Take 1,000 Units by mouth daily. , Disp: , Rfl:  .  diclofenac sodium (VOLTAREN) 1 % GEL, Apply 1 application topically 4 (four) times daily., Disp: 100 g, Rfl: 0 .  hydrochlorothiazide (MICROZIDE) 12.5 MG capsule, Take 1 capsule (12.5 mg total) by mouth daily., Disp: 90 capsule, Rfl: 1 .  olmesartan (BENICAR) 40 MG tablet, Take 1 tablet (40 mg total) by mouth daily., Disp: 90 tablet, Rfl: 1 .  omega-3 acid ethyl esters (LOVAZA) 1 G capsule, Take 2 g by mouth 2 (two) times daily., Disp: , Rfl:  .  VITAMIN E PO, Take by mouth., Disp: , Rfl:    Allergies  Allergen Reactions  . Aspirin     rash  . Penicillins Rash    The patient states she uses post menopausal status for birth control. Last LMP was No LMP recorded. Patient has had a hysterectomy.. Negative for Dysmenorrhea  Negative for: breast discharge, breast lump(s), breast pain and breast self exam. Associated symptoms include abnormal vaginal bleeding. Pertinent negatives include abnormal bleeding (hematology), anxiety, decreased libido, depression, difficulty falling sleep, dyspareunia, history of infertility, nocturia, sexual dysfunction, sleep disturbances, urinary incontinence, urinary urgency, vaginal discharge and vaginal itching. Diet regular.The patient states her exercise level is  minimal.  . The patient's tobacco use is:  Social History   Tobacco Use  Smoking Status  Never Smoker  Smokeless Tobacco Never Used  . She has been exposed to passive smoke. The patient's alcohol use is:  Social History   Substance and Sexual Activity  Alcohol Use No    Review of Systems  Constitutional: Negative.   HENT: Negative.   Eyes: Negative.  Negative for blurred vision.  Respiratory: Negative.  Negative for shortness of breath.   Cardiovascular: Negative.  Negative for chest pain and palpitations.  Endocrine: Negative.   Genitourinary: Negative.   Musculoskeletal:  Negative.   Skin: Negative.   Allergic/Immunologic: Negative.   Neurological: Negative.   Hematological: Negative.   Psychiatric/Behavioral: Negative.      Today's Vitals   08/06/19 1129  BP: 130/76  Pulse: 75  Temp: 98 F (36.7 C)  TempSrc: Oral  Weight: 255 lb 6.4 oz (115.8 kg)  Height: 5' 2.2" (1.58 m)   Body mass index is 46.41 kg/m.   Objective:  Physical Exam Vitals and nursing note reviewed.  Constitutional:      Appearance: Normal appearance. She is obese.  HENT:     Head: Normocephalic and atraumatic.     Right Ear: Tympanic membrane, ear canal and external ear normal.     Left Ear: Tympanic membrane, ear canal and external ear normal.     Nose:     Comments: Deferred, masked    Mouth/Throat:     Comments: Deferred, masked Eyes:     Extraocular Movements: Extraocular movements intact.     Conjunctiva/sclera: Conjunctivae normal.     Pupils: Pupils are equal, round, and reactive to light.  Cardiovascular:     Rate and Rhythm: Normal rate and regular rhythm.     Pulses: Normal pulses.     Heart sounds: Normal heart sounds.  Pulmonary:     Effort: Pulmonary effort is normal.     Breath sounds: Normal breath sounds.  Abdominal:     General: Bowel sounds are normal.     Palpations: Abdomen is soft.     Comments: Obese,rounded, soft. Difficult to assess organomegaly  Genitourinary:    Comments: deferred Musculoskeletal:        General: Normal range of motion.     Cervical back: Normal range of motion and neck supple.  Skin:    General: Skin is warm and dry.  Neurological:     General: No focal deficit present.     Mental Status: She is alert and oriented to person, place, and time.  Psychiatric:        Mood and Affect: Mood normal.        Behavior: Behavior normal.         Assessment And Plan:     1. Routine general medical examination at health care facility  A full exam was performed.  Importance of monthly self breast exams was discussed  with the patient. PATIENT IS ADVISED TO GET 30-45 MINUTES REGULAR EXERCISE NO LESS THAN FOUR TO FIVE DAYS PER WEEK - BOTH WEIGHTBEARING EXERCISES AND AEROBIC ARE RECOMMENDED.  SHE IS ADVISED TO FOLLOW A HEALTHY DIET WITH AT LEAST SIX FRUITS/VEGGIES PER DAY, DECREASE INTAKE OF RED MEAT, AND TO INCREASE FISH INTAKE TO TWO DAYS PER WEEK.  MEATS/FISH SHOULD NOT BE FRIED, BAKED OR BROILED IS PREFERABLE.  I SUGGEST WEARING SPF 50 SUNSCREEN ON EXPOSED PARTS AND ESPECIALLY WHEN IN THE DIRECT SUNLIGHT FOR AN EXTENDED PERIOD OF TIME.  PLEASE AVOID FAST FOOD RESTAURANTS AND INCREASE YOUR WATER INTAKE.  - CMP14+EGFR - CBC - Lipid panel -  Hemoglobin A1c - Insulin, random(561)  2. Essential hypertension  Chronic, controlled. She will continue with current meds. She is encouraged to avoid adding salt to her foods. EKG performed, NSR w/o acute changes. Importance of regular exercise was discussed with the patient. She is advised to aim for at least 150 minutes per week.   - POCT Urinalysis Dipstick (81002) - POCT UA - Microalbumin - EKG 12-Lead   [name], MD    THE PATIENT IS ENCOURAGED TO PRACTICE SOCIAL DISTANCING DUE TO THE COVID-19 PANDEMIC.

## 2019-08-07 LAB — CMP14+EGFR
ALT: 20 IU/L (ref 0–32)
AST: 23 IU/L (ref 0–40)
Albumin/Globulin Ratio: 1.3 (ref 1.2–2.2)
Albumin: 4.2 g/dL (ref 3.8–4.8)
Alkaline Phosphatase: 105 IU/L (ref 48–121)
BUN/Creatinine Ratio: 16 (ref 12–28)
BUN: 16 mg/dL (ref 8–27)
Bilirubin Total: 0.3 mg/dL (ref 0.0–1.2)
CO2: 23 mmol/L (ref 20–29)
Calcium: 9.5 mg/dL (ref 8.7–10.3)
Chloride: 100 mmol/L (ref 96–106)
Creatinine, Ser: 1 mg/dL (ref 0.57–1.00)
GFR calc Af Amer: 70 mL/min/{1.73_m2} (ref >59)
GFR calc non Af Amer: 61 mL/min/{1.73_m2} (ref >59)
Globulin, Total: 3.2 g/dL (ref 1.5–4.5)
Glucose: 103 mg/dL — ABNORMAL HIGH (ref 65–99)
Potassium: 3.9 mmol/L (ref 3.5–5.2)
Sodium: 139 mmol/L (ref 134–144)
Total Protein: 7.4 g/dL (ref 6.0–8.5)

## 2019-08-07 LAB — HEMOGLOBIN A1C
Est. average glucose Bld gHb Est-mCnc: 114 mg/dL
Hgb A1c MFr Bld: 5.6 % (ref 4.8–5.6)

## 2019-08-07 LAB — CBC
Hematocrit: 40.7 % (ref 34.0–46.6)
Hemoglobin: 13.3 g/dL (ref 11.1–15.9)
MCH: 30.1 pg (ref 26.6–33.0)
MCHC: 32.7 g/dL (ref 31.5–35.7)
MCV: 92 fL (ref 79–97)
Platelets: 191 10*3/uL (ref 150–450)
RBC: 4.42 x10E6/uL (ref 3.77–5.28)
RDW: 12.2 % (ref 11.7–15.4)
WBC: 6 10*3/uL (ref 3.4–10.8)

## 2019-08-07 LAB — LIPID PANEL
Chol/HDL Ratio: 4.9 ratio — ABNORMAL HIGH (ref 0.0–4.4)
Cholesterol, Total: 204 mg/dL — ABNORMAL HIGH (ref 100–199)
HDL: 42 mg/dL (ref >39)
LDL Chol Calc (NIH): 131 mg/dL — ABNORMAL HIGH (ref 0–99)
Triglycerides: 171 mg/dL — ABNORMAL HIGH (ref 0–149)
VLDL Cholesterol Cal: 31 mg/dL (ref 5–40)

## 2019-08-07 LAB — INSULIN, RANDOM: INSULIN: 20.4 u[IU]/mL (ref 2.6–24.9)

## 2019-08-13 ENCOUNTER — Other Ambulatory Visit: Payer: Self-pay

## 2019-08-13 MED ORDER — CETIRIZINE HCL 10 MG PO TABS: 10.0000 mg | ORAL_TABLET | Freq: Every day | ORAL | 2 refills | Status: DC

## 2019-08-13 MED ORDER — HYDROCHLOROTHIAZIDE 12.5 MG PO CAPS: 12.5000 mg | ORAL_CAPSULE | Freq: Every day | ORAL | 1 refills | Status: DC

## 2019-10-10 ENCOUNTER — Encounter: Payer: Self-pay | Admitting: Internal Medicine

## 2019-10-25 ENCOUNTER — Telehealth: Payer: Self-pay

## 2019-10-25 ENCOUNTER — Telehealth: Admitting: Nurse Practitioner

## 2019-10-25 NOTE — Telephone Encounter (Signed)
PT CALLED WANTING APPT FOR ALLERGY VISIT ADV PT THAT PROVIDERS REQ THAT SHE IS SEEN VIRTUALLY OFFERED MYCHART VISIT W/MOORE TODAY PT DECLINED STATED THAT SHE NEEDS TO BE SEEN FACE TO FACE. PT STATED SHE WILL TAKE CHANCES AND GO TO URGENT CARE

## 2019-10-25 NOTE — Telephone Encounter (Signed)
PT CALLED STATED THAT SHE WAS DEALIN

## 2019-10-26 ENCOUNTER — Encounter (HOSPITAL_COMMUNITY): Payer: Self-pay

## 2019-10-26 ENCOUNTER — Ambulatory Visit (HOSPITAL_COMMUNITY)
Admission: RE | Admit: 2019-10-26 | Discharge: 2019-10-26 | Disposition: A | Discharge status: Home / Self Care | Source: Ambulatory Visit | Attending: Physician Assistant | Admitting: Physician Assistant

## 2019-10-26 ENCOUNTER — Other Ambulatory Visit: Payer: Self-pay

## 2019-10-26 VITALS — BP 151/76 | HR 70 | Temp 99.4°F | Resp 16 | Ht 65.0 in | Wt 256.0 lb

## 2019-10-26 DIAGNOSIS — M1990 Unspecified osteoarthritis, unspecified site: Secondary | ICD-10-CM | POA: Insufficient documentation

## 2019-10-26 DIAGNOSIS — Z6841 Body Mass Index (BMI) 40.0 and over, adult: Secondary | ICD-10-CM | POA: Diagnosis not present

## 2019-10-26 DIAGNOSIS — Z880 Allergy status to penicillin: Secondary | ICD-10-CM | POA: Diagnosis not present

## 2019-10-26 DIAGNOSIS — Z8249 Family history of ischemic heart disease and other diseases of the circulatory system: Secondary | ICD-10-CM | POA: Diagnosis not present

## 2019-10-26 DIAGNOSIS — Z20822 Contact with and (suspected) exposure to covid-19: Secondary | ICD-10-CM | POA: Insufficient documentation

## 2019-10-26 DIAGNOSIS — I10 Essential (primary) hypertension: Secondary | ICD-10-CM | POA: Diagnosis not present

## 2019-10-26 DIAGNOSIS — Z79899 Other long term (current) drug therapy: Secondary | ICD-10-CM | POA: Diagnosis not present

## 2019-10-26 DIAGNOSIS — Z791 Long term (current) use of non-steroidal anti-inflammatories (NSAID): Secondary | ICD-10-CM | POA: Insufficient documentation

## 2019-10-26 DIAGNOSIS — J069 Acute upper respiratory infection, unspecified: Secondary | ICD-10-CM

## 2019-10-26 DIAGNOSIS — Z886 Allergy status to analgesic agent status: Secondary | ICD-10-CM | POA: Insufficient documentation

## 2019-10-26 DIAGNOSIS — J0100 Acute maxillary sinusitis, unspecified: Secondary | ICD-10-CM | POA: Diagnosis not present

## 2019-10-26 MED ORDER — FLUTICASONE PROPIONATE 50 MCG/ACT NA SUSP: 1.0000 | Freq: Two times a day (BID) | NASAL | 0 refills | Status: DC

## 2019-10-26 MED ORDER — GUAIFENESIN ER 600 MG PO TB12: 600.0000 mg | ORAL_TABLET | Freq: Two times a day (BID) | ORAL | 0 refills | Status: AC

## 2019-10-26 MED ORDER — DOXYCYCLINE HYCLATE 100 MG PO CAPS: 100.0000 mg | ORAL_CAPSULE | Freq: Two times a day (BID) | ORAL | 0 refills | Status: AC

## 2019-10-26 MED ORDER — SALINE SPRAY 0.65 % NA SOLN: 1.0000 | NASAL | 0 refills | Status: AC | PRN

## 2019-10-26 MED ORDER — BENZONATATE 100 MG PO CAPS: 100.0000 mg | ORAL_CAPSULE | Freq: Three times a day (TID) | ORAL | 0 refills | Status: DC

## 2019-10-26 NOTE — ED Triage Notes (Signed)
Pt c/o clogged ears and post nasal dripx6 days.

## 2019-10-26 NOTE — Discharge Instructions (Signed)
Take the medicines as prescribed -Tessalon for cough -Mucinex for congestion -Use Flonase twice a day for 7 days then continue once a day -Use nasal saline throughout the day -Continue Zyrtec, 1 tablet daily -Only start the doxycycline if you have not had significant improvement in 2 to 3 days.  Drink plenty of water when taking this  Aloe up with your primary care as needed  Severe symptoms of shortness of breath, high fever, chest pain go to the emergency department  If your Covid-19 test is positive, you will receive a phone call from [facility] regarding your results. Negative test results are not called. Both positive and negative results area always visible on MyChart. If you do not have a MyChart account, sign up instructions are in your discharge papers.   Persons who are directed to care for themselves at home may discontinue isolation under the following conditions:   At least 10 days have passed since symptom onset and  At least 24 hours have passed without running a fever (this means without the use of fever-reducing medications) and  Other symptoms have improved.  Persons infected with COVID-19 who never develop symptoms may discontinue isolation and other precautions 10 days after the date of their first positive COVID-19 test.

## 2019-10-26 NOTE — ED Provider Notes (Signed)
[facility]    CSN: [id number] Arrival date & time: 10/26/19  1643      History   Chief Complaint Chief Complaint  Patient presents with  . clogged ears    HPI Jasmine Harrington is a 64 y.o. female.   Patient with history of sinus infections presents for 6-day history of sinus congestion.  She reports symptoms started on Sunday.  She has had sinus congestion with runny nose, facial pressure and ear pressure.  She reports she is also had a feeling of drainage in her throat.  Drainage in her throat describes a scratchy sore throat.  She denies fevers or chills.  She does endorse an occasional cough at night due to the drainage.  Denies difficulty breathing.  Denies ear pain.  She has been taking Zyrtec twice a day and started using Flonase.  She states these have helped some but she is still quite congested.  She reports typically she gets a sinus infection this time every year and typically needs antibiotics.  She reports she received both Covid vaccines.  She has not been around by sick.  Denies nausea, vomiting or diarrhea.  No change in taste or smell.  Year     Past Medical History:  Diagnosis Date  . Arthritis   . Hypertension   . Pneumonia   . Tuberculosis    pt was pos for tb . had to take 6 months of meds .     Patient Active Problem List   Diagnosis Date Noted  . Class 3 severe obesity due to excess calories with serious comorbidity and body mass index (BMI) of 45.0 to 49.9 in adult (HCC) 03/26/2019  . Encounter for general adult medical examination without abnormal findings 06/22/2017  . Paresthesia 07/07/2016  . Hypertension 12/14/2011    Past Surgical History:  Procedure Laterality Date  . ABDOMINAL HYSTERECTOMY  2008  . COLONOSCOPY  10/2017  . KNEE SURGERY Right 1965    OB History    Gravida  2   Para  1   Term  1   Preterm      AB  1   Living  1     SAB  1   TAB      Ectopic      Multiple      Live Births                Home Medications    Prior to Admission medications   Medication Sig Start Date End Date Taking? Authorizing Provider  albuterol (PROVENTIL HFA;VENTOLIN HFA) 108 (90 Base) MCG/ACT inhaler Inhale 2 puffs into the lungs every 6 (six) hours as needed for wheezing or shortness of breath. 04/03/18   Arnette Felts, FNP  azelastine (ASTELIN) 0.1 % nasal spray Place 1 spray into both nostrils 2 (two) times daily. Use in each nostril as directed 12/19/17   Dorothyann Peng, MD  benzonatate (TESSALON) 100 MG capsule Take 1 capsule (100 mg total) by mouth every 8 (eight) hours. 10/26/19   Rhen Dossantos, Veryl Speak, PA-C  cetirizine (ZYRTEC) 10 MG tablet Take 1 tablet (10 mg total) by mouth daily. 08/13/19   Dorothyann Peng, MD  cholecalciferol (VITAMIN D) 1000 UNITS tablet Take 1,000 Units by mouth daily.     [provider]  diclofenac sodium (VOLTAREN) 1 % GEL Apply 1 application topically 4 (four) times daily. 12/22/18   Domenick Gong, MD  doxycycline (VIBRAMYCIN) 100 MG capsule Take 1 capsule (100 mg total) by mouth  2 (two) times daily for 7 days. 10/26/19 11/02/19  Davidlee Jeanbaptiste, Veryl SpeakJacob E, PA-C  fluticasone (FLONASE) 50 MCG/ACT nasal spray Place 1 spray into both nostrils in the morning and at bedtime for 7 days. 10/26/19 11/02/19  Cailen Texeira, Veryl SpeakJacob E, PA-C  guaiFENesin (MUCINEX) 600 MG 12 hr tablet Take 1 tablet (600 mg total) by mouth 2 (two) times daily for 7 days. 10/26/19 11/02/19  Tobias Avitabile, Veryl SpeakJacob E, PA-C  hydrochlorothiazide (MICROZIDE) 12.5 MG capsule Take 1 capsule (12.5 mg total) by mouth daily. 08/13/19   Dorothyann PengSanders, Robyn, MD  olmesartan (BENICAR) 40 MG tablet Take 1 tablet (40 mg total) by mouth daily. 05/28/19   Dorothyann PengSanders, Robyn, MD  omega-3 acid ethyl esters (LOVAZA) 1 G capsule Take 2 g by mouth 2 (two) times daily.    [provider]  sodium chloride (OCEAN) 0.65 % SOLN nasal spray Place 1 spray into both nostrils as needed for congestion. 10/26/19   Nakyah Erdmann, Veryl SpeakJacob E, PA-C  VITAMIN E PO Take by mouth.    [provider]    Family History Family History  Problem Relation Age of Onset  . Hypertension Paternal Grandfather   . Hypertension Paternal Grandmother   . Hypertension Maternal Grandmother   . Hypertension Maternal Grandfather   . Prostate cancer Father   . Breast cancer Mother   . Leukemia Brother     Social History Social History   Tobacco Use  . Smoking status: Never Smoker  . Smokeless tobacco: Never Used  Vaping Use  . Vaping Use: Never used  Substance Use Topics  . Alcohol use: No  . Drug use: No     Allergies   Aspirin and Penicillins   Review of Systems Review of Systems   Physical Exam Triage Vital Signs ED Triage Vitals [10/26/19 1728]  Enc Vitals Group     BP (!) 151/76     Pulse Rate 70     Resp 16     Temp 99.4 F (37.4 C)     Temp Source Oral     SpO2 95 %     Weight 256 lb (116.1 kg)     Height 5\' 5"  (1.651 m)     Head Circumference      Peak Flow      Pain Score 0     Pain Loc      Pain Edu?      Excl. in GC?    No data found.  Updated Vital Signs BP (!) 151/76   Pulse 70   Temp 99.4 F (37.4 C) (Oral)   Resp 16   Ht 5\' 5"  (1.651 m)   Wt 256 lb (116.1 kg)   SpO2 95%   BMI 42.60 kg/m   Visual Acuity Right Eye Distance:   Left Eye Distance:   Bilateral Distance:    Right Eye Near:   Left Eye Near:    Bilateral Near:     Physical Exam Vitals and nursing note reviewed.  Constitutional:      General: She is not in acute distress.    Appearance: She is well-developed. She is not ill-appearing.  HENT:     Head: Normocephalic and atraumatic.     Comments: Mild tenderness across the frontal and maxillary sinuses    Right Ear: Tympanic membrane normal.     Left Ear: Tympanic membrane normal.     Nose: Congestion and rhinorrhea present.     Mouth/Throat:     Mouth: Mucous membranes are moist.  Comments: Postnasal drip visible Eyes:     Conjunctiva/sclera: Conjunctivae normal.  Cardiovascular:     Rate and  Rhythm: Normal rate and regular rhythm.     Heart sounds: No murmur heard.   Pulmonary:     Effort: Pulmonary effort is normal. No respiratory distress.     Breath sounds: Normal breath sounds. No wheezing, rhonchi or rales.  Abdominal:     Palpations: Abdomen is soft.     Tenderness: There is no abdominal tenderness.  Musculoskeletal:     Cervical back: Neck supple.  Skin:    General: Skin is warm and dry.  Neurological:     Mental Status: She is alert.      UC Treatments / Results  Labs (all labs ordered are listed, but only abnormal results are displayed) Labs Reviewed  SARS CORONAVIRUS 2 (TAT 6-24 HRS)    EKG   Radiology No results found.  Procedures Procedures (including critical care time)  Medications Ordered in UC Medications - No data to display  Initial Impression / Assessment and Plan / UC Course  I have reviewed the triage vital signs and the nursing notes.  Pertinent labs & imaging results that were available during my care of the patient were reviewed by me and considered in my medical decision making (see chart for details).     #Viral URI #Maxillary sinusitis Patient 64 year old presenting with viral upper respiratory symptoms and maxillary sinusitis.  Given is only been 6 days discussed prescribing doxycycline but a delayed start she is not improving over the next 2 to 3 days.  We will have her continue Zyrtec and use Flonase twice a day.  We will have her use Mucinex.  Tessalon for cough.  We will send Covid.  Discussed return and follow-up precautions.  Discussed emergency department cautions.  Patient verbalized understand plan of care. Final Clinical Impressions(s) / UC Diagnoses   Final diagnoses:  Acute maxillary sinusitis, recurrence not specified  Viral URI     Discharge Instructions     Take the medicines as prescribed -Tessalon for cough -Mucinex for congestion -Use Flonase twice a day for 7 days then continue once a day -Use  nasal saline throughout the day -Continue Zyrtec, 1 tablet daily -Only start the doxycycline if you have not had significant improvement in 2 to 3 days.  Drink plenty of water when taking this  Aloe up with your primary care as needed  Severe symptoms of shortness of breath, high fever, chest pain go to the emergency department  If your Covid-19 test is positive, you will receive a phone call from [facility] regarding your results. Negative test results are not called. Both positive and negative results area always visible on MyChart. If you do not have a MyChart account, sign up instructions are in your discharge papers.   Persons who are directed to care for themselves at home may discontinue isolation under the following conditions:  . At least 10 days have passed since symptom onset and . At least 24 hours have passed without running a fever (this means without the use of fever-reducing medications) and . Other symptoms have improved.  Persons infected with COVID-19 who never develop symptoms may discontinue isolation and other precautions 10 days after the date of their first positive COVID-19 test.      ED Prescriptions    Medication Sig Dispense Auth. Provider   doxycycline (VIBRAMYCIN) 100 MG capsule Take 1 capsule (100 mg total) by mouth 2 (two) times  daily for 7 days. 14 capsule Sidnie Swalley, Veryl Speak, PA-C   fluticasone (FLONASE) 50 MCG/ACT nasal spray Place 1 spray into both nostrils in the morning and at bedtime for 7 days. 1 g Aceton Kinnear, Veryl Speak, PA-C   sodium chloride (OCEAN) 0.65 % SOLN nasal spray Place 1 spray into both nostrils as needed for congestion. 60 mL Kaveon Blatz, Veryl Speak, PA-C   guaiFENesin (MUCINEX) 600 MG 12 hr tablet Take 1 tablet (600 mg total) by mouth 2 (two) times daily for 7 days. 14 tablet Mong Neal, Veryl Speak, PA-C   benzonatate (TESSALON) 100 MG capsule Take 1 capsule (100 mg total) by mouth every 8 (eight) hours. 21 capsule Tyanne Derocher, Veryl Speak, PA-C     PDMP not reviewed this  encounter.   Hermelinda Medicus, PA-C 10/26/19 2300

## 2019-10-27 LAB — SARS CORONAVIRUS 2 (TAT 6-24 HRS): SARS Coronavirus 2: NEGATIVE

## 2019-11-28 ENCOUNTER — Other Ambulatory Visit: Payer: Self-pay

## 2019-11-28 MED ORDER — OLMESARTAN MEDOXOMIL 40 MG PO TABS: 40.0000 mg | ORAL_TABLET | Freq: Every day | ORAL | 2 refills | Status: DC

## 2019-11-28 NOTE — Telephone Encounter (Signed)
I left the pt a message that her refill for benicar has been faxed to her pharmacy.

## 2020-02-05 ENCOUNTER — Other Ambulatory Visit: Payer: Self-pay

## 2020-02-05 ENCOUNTER — Encounter: Payer: Self-pay | Admitting: Internal Medicine

## 2020-02-05 ENCOUNTER — Ambulatory Visit (INDEPENDENT_AMBULATORY_CARE_PROVIDER_SITE_OTHER): Admitting: Internal Medicine

## 2020-02-05 VITALS — BP 126/82 | HR 68 | Temp 98.3°F | Ht 65.0 in | Wt 256.4 lb

## 2020-02-05 DIAGNOSIS — E7800 Pure hypercholesterolemia, unspecified: Secondary | ICD-10-CM | POA: Diagnosis not present

## 2020-02-05 DIAGNOSIS — Z1231 Encounter for screening mammogram for malignant neoplasm of breast: Secondary | ICD-10-CM | POA: Diagnosis not present

## 2020-02-05 DIAGNOSIS — Z23 Encounter for immunization: Secondary | ICD-10-CM | POA: Diagnosis not present

## 2020-02-05 DIAGNOSIS — I10 Essential (primary) hypertension: Secondary | ICD-10-CM

## 2020-02-05 DIAGNOSIS — Z6841 Body Mass Index (BMI) 40.0 and over, adult: Secondary | ICD-10-CM

## 2020-02-05 LAB — BMP8+EGFR
BUN/Creatinine Ratio: 15 (ref 12–28)
BUN: 14 mg/dL (ref 8–27)
CO2: 24 mmol/L (ref 20–29)
Calcium: 9 mg/dL (ref 8.7–10.3)
Chloride: 104 mmol/L (ref 96–106)
Creatinine, Ser: 0.92 mg/dL (ref 0.57–1.00)
GFR calc Af Amer: 77 mL/min/{1.73_m2} (ref >59)
GFR calc non Af Amer: 66 mL/min/{1.73_m2} (ref >59)
Glucose: 113 mg/dL — ABNORMAL HIGH (ref 65–99)
Potassium: 3.5 mmol/L (ref 3.5–5.2)
Sodium: 140 mmol/L (ref 134–144)

## 2020-02-05 LAB — LIPID PANEL
Chol/HDL Ratio: 4.5 ratio — ABNORMAL HIGH (ref 0.0–4.4)
Cholesterol, Total: 162 mg/dL (ref 100–199)
HDL: 36 mg/dL — ABNORMAL LOW (ref >39)
LDL Chol Calc (NIH): 107 mg/dL — ABNORMAL HIGH (ref 0–99)
Triglycerides: 103 mg/dL (ref 0–149)
VLDL Cholesterol Cal: 19 mg/dL (ref 5–40)

## 2020-02-05 MED ORDER — HYDROCHLOROTHIAZIDE 12.5 MG PO CAPS: 12.5000 mg | ORAL_CAPSULE | Freq: Every day | ORAL | 1 refills | Status: DC

## 2020-02-05 NOTE — Progress Notes (Signed)
I,Katawbba Wiggins,acting as a Education administrator for [name], MD.,have documented all relevant documentation on the behalf of [name], MD,as directed by  [name], MD while in the presence of [name], MD.  This visit occurred during the SARS-CoV-2 public health emergency.  Safety protocols were in place, including screening questions prior to the visit, additional usage of staff PPE, and extensive cleaning of exam room while observing appropriate contact time as indicated for disinfecting solutions.  Subjective:     Patient ID: Jasmine Harrington , female    DOB: 1955-10-02 , 64 y.o.   MRN: [id number]   Chief Complaint  Patient presents with  . Hypertension    HPI  She presents today for BP check. She reports compliance with meds  Hypertension This is a chronic problem. The current episode started more than 1 year ago. The problem has been gradually improving since onset. The problem is controlled. Pertinent negatives include no blurred vision, chest pain, palpitations or shortness of breath. Past treatments include angiotensin blockers and diuretics. The current treatment provides moderate improvement. Compliance problems include exercise.  Hypertensive end-organ damage includes kidney disease.     Past Medical History:  Diagnosis Date  . Arthritis   . Hypertension   . Pneumonia   . Tuberculosis    pt was pos for tb . had to take 6 months of meds .      Family History  Problem Relation Age of Onset  . Hypertension Paternal Grandfather   . Hypertension Paternal Grandmother   . Hypertension Maternal Grandmother   . Hypertension Maternal Grandfather   . Prostate cancer Father   . Breast cancer Mother   . Leukemia Brother      Current Outpatient Medications:  .  albuterol (PROVENTIL HFA;VENTOLIN HFA) 108 (90 Base) MCG/ACT inhaler, Inhale 2 puffs into the lungs every 6 (six) hours as needed for wheezing or shortness of breath., Disp: 1 Inhaler, Rfl: 2 .   azelastine (ASTELIN) 0.1 % nasal spray, Place 1 spray into both nostrils 2 (two) times daily. Use in each nostril as directed, Disp: 90 mL, Rfl: 3 .  cetirizine (ZYRTEC) 10 MG tablet, Take 1 tablet (10 mg total) by mouth daily., Disp: 90 tablet, Rfl: 2 .  cholecalciferol (VITAMIN D) 1000 UNITS tablet, Take 1,000 Units by mouth daily. , Disp: , Rfl:  .  diclofenac sodium (VOLTAREN) 1 % GEL, Apply 1 application topically 4 (four) times daily., Disp: 100 g, Rfl: 0 .  fluticasone (FLONASE) 50 MCG/ACT nasal spray, Place 1 spray into both nostrils in the morning and at bedtime for 7 days., Disp: 1 g, Rfl: 0 .  olmesartan (BENICAR) 40 MG tablet, Take 1 tablet (40 mg total) by mouth daily., Disp: 90 tablet, Rfl: 2 .  omega-3 acid ethyl esters (LOVAZA) 1 G capsule, Take 1 g by mouth 2 (two) times daily. , Disp: , Rfl:  .  sodium chloride (OCEAN) 0.65 % SOLN nasal spray, Place 1 spray into both nostrils as needed for congestion., Disp: 60 mL, Rfl: 0 .  VITAMIN E PO, Take by mouth., Disp: , Rfl:  .  hydrochlorothiazide (MICROZIDE) 12.5 MG capsule, Take 1 capsule (12.5 mg total) by mouth daily., Disp: 90 capsule, Rfl: 1   Allergies  Allergen Reactions  . Aspirin     rash  . Penicillins Rash     Review of Systems  Constitutional: Negative.   Eyes: Negative for blurred vision.  Respiratory: Negative.  Negative for shortness  of breath.   Cardiovascular: Negative.  Negative for chest pain and palpitations.  Gastrointestinal: Negative.   Psychiatric/Behavioral: Negative.   All other systems reviewed and are negative.    Today's Vitals   02/05/20 0911  BP: 126/82  Pulse: 68  Temp: 98.3 F (36.8 C)  TempSrc: Oral  Weight: 256 lb 6.4 oz (116.3 kg)  Height: $Remove'5\' 5"'[id number]$  (1.651 m)  PainSc: 0-No pain   Body mass index is 42.67 kg/m.  Wt Readings from Last 3 Encounters:  02/05/20 256 lb 6.4 oz (116.3 kg)  10/26/19 256 lb (116.1 kg)  08/06/19 255 lb 6.4 oz (115.8 kg)   Objective:  Physical  Exam Vitals and nursing note reviewed.  Constitutional:      Appearance: Normal appearance. She is obese.  HENT:     Head: Normocephalic and atraumatic.  Cardiovascular:     Rate and Rhythm: Normal rate and regular rhythm.     Heart sounds: Normal heart sounds.  Pulmonary:     Breath sounds: Normal breath sounds.  Skin:    General: Skin is warm.  Neurological:     General: No focal deficit present.     Mental Status: She is alert and oriented to person, place, and time.         Assessment And Plan:     1. Essential hypertension Comments: Chronic, controlled. She will continue with current meds. I will check renal function today. She will f/u in 6 months for re-evaluation.  - BMP8+EGFR  2. Pure hypercholesterolemia Comments: Chronic, last LDL 131. Pt is aware that her goal is less than 100.  She is encouraged to avoid fried foods and to incorporate more exercise into her daily routi - Lipid panel  3. Class 3 severe obesity due to excess calories with serious comorbidity and body mass index (BMI) of 40.0 to 44.9 in adult [facility]) Comments: BMI 42. She is encouraged to aim for at least 150 minutes of exercise per week.   4. Need for vaccination Comments: She was given high dose flu vaccine.  - Flu Vaccine QUAD 6+ mos PF IM (Fluarix Quad PF)  5. Encounter for screening mammogram for malignant neoplasm of breast Comments: She agrees to referral for yearly mammogram.  - MM Digital Screening     Patient was given opportunity to ask questions. Patient verbalized understanding of the plan and was able to repeat key elements of the plan. All questions were answered to their satisfaction.  [name], MD   I, [name], MD, have reviewed all documentation for this visit. The documentation on 02/18/20 for the exam, diagnosis, procedures, and orders are all accurate and complete.  THE PATIENT IS ENCOURAGED TO PRACTICE SOCIAL DISTANCING DUE TO THE COVID-19 PANDEMIC.

## 2020-02-18 NOTE — Patient Instructions (Signed)

## 2020-03-24 ENCOUNTER — Ambulatory Visit

## 2020-04-07 ENCOUNTER — Ambulatory Visit

## 2020-04-07 ENCOUNTER — Other Ambulatory Visit: Payer: Self-pay | Admitting: Internal Medicine

## 2020-04-07 DIAGNOSIS — Z1231 Encounter for screening mammogram for malignant neoplasm of breast: Secondary | ICD-10-CM

## 2020-04-09 ENCOUNTER — Ambulatory Visit

## 2020-05-26 ENCOUNTER — Ambulatory Visit
Admission: RE | Admit: 2020-05-26 | Discharge: 2020-05-26 | Disposition: A | Discharge status: Home / Self Care | Source: Ambulatory Visit | Attending: Internal Medicine | Admitting: Internal Medicine

## 2020-05-26 ENCOUNTER — Other Ambulatory Visit: Payer: Self-pay

## 2020-07-08 ENCOUNTER — Other Ambulatory Visit: Payer: Self-pay

## 2020-07-08 MED ORDER — CETIRIZINE HCL 10 MG PO TABS: 10.0000 mg | ORAL_TABLET | Freq: Every day | ORAL | 2 refills | Status: DC

## 2020-08-11 ENCOUNTER — Other Ambulatory Visit: Payer: Self-pay

## 2020-08-11 ENCOUNTER — Encounter: Payer: Self-pay | Admitting: Internal Medicine

## 2020-08-11 ENCOUNTER — Ambulatory Visit (INDEPENDENT_AMBULATORY_CARE_PROVIDER_SITE_OTHER): Admitting: Internal Medicine

## 2020-08-11 VITALS — BP 134/86 | HR 65 | Temp 97.5°F | Ht 61.2 in | Wt 256.2 lb

## 2020-08-11 DIAGNOSIS — Z6841 Body Mass Index (BMI) 40.0 and over, adult: Secondary | ICD-10-CM

## 2020-08-11 DIAGNOSIS — Z0000 Encounter for general adult medical examination without abnormal findings: Secondary | ICD-10-CM | POA: Diagnosis not present

## 2020-08-11 DIAGNOSIS — E2839 Other primary ovarian failure: Secondary | ICD-10-CM | POA: Diagnosis not present

## 2020-08-11 DIAGNOSIS — I10 Essential (primary) hypertension: Secondary | ICD-10-CM

## 2020-08-11 LAB — CMP14+EGFR
ALT: 15 IU/L (ref 0–32)
AST: 20 IU/L (ref 0–40)
Albumin/Globulin Ratio: 1.4 (ref 1.2–2.2)
Albumin: 4.2 g/dL (ref 3.8–4.8)
Alkaline Phosphatase: 87 IU/L (ref 44–121)
BUN/Creatinine Ratio: 16 (ref 12–28)
BUN: 15 mg/dL (ref 8–27)
Bilirubin Total: 0.4 mg/dL (ref 0.0–1.2)
CO2: 22 mmol/L (ref 20–29)
Calcium: 9.5 mg/dL (ref 8.7–10.3)
Chloride: 103 mmol/L (ref 96–106)
Creatinine, Ser: 0.93 mg/dL (ref 0.57–1.00)
Globulin, Total: 3.1 g/dL (ref 1.5–4.5)
Glucose: 105 mg/dL — ABNORMAL HIGH (ref 65–99)
Potassium: 3.8 mmol/L (ref 3.5–5.2)
Sodium: 139 mmol/L (ref 134–144)
Total Protein: 7.3 g/dL (ref 6.0–8.5)
eGFR: 69 mL/min/{1.73_m2} (ref >59)

## 2020-08-11 LAB — POCT URINALYSIS DIPSTICK
Bilirubin, UA: NEGATIVE
Blood, UA: NEGATIVE
Glucose, UA: NEGATIVE
Ketones, UA: NEGATIVE
Leukocytes, UA: NEGATIVE
Nitrite, UA: NEGATIVE
Protein, UA: NEGATIVE
Spec Grav, UA: >=1.03 — AB (ref 1.010–1.025)
Urobilinogen, UA: 0.2 E.U./dL
pH, UA: 6.5 (ref 5.0–8.0)

## 2020-08-11 LAB — CBC
Hematocrit: 40.8 % (ref 34.0–46.6)
Hemoglobin: 13.2 g/dL (ref 11.1–15.9)
MCH: 29.3 pg (ref 26.6–33.0)
MCHC: 32.4 g/dL (ref 31.5–35.7)
MCV: 91 fL (ref 79–97)
Platelets: 241 10*3/uL (ref 150–450)
RBC: 4.5 x10E6/uL (ref 3.77–5.28)
RDW: 12.3 % (ref 11.7–15.4)
WBC: 6.3 10*3/uL (ref 3.4–10.8)

## 2020-08-11 LAB — POCT UA - MICROALBUMIN
Albumin/Creatinine Ratio, Urine, POC: <30
Creatinine, POC: 200 mg/dL
Microalbumin Ur, POC: 10 mg/L

## 2020-08-11 MED ORDER — FLUTICASONE PROPIONATE 50 MCG/ACT NA SUSP: 1.0000 | Freq: Two times a day (BID) | NASAL | 0 refills | Status: DC

## 2020-08-11 MED ORDER — HYDROCHLOROTHIAZIDE 12.5 MG PO CAPS: 12.5000 mg | ORAL_CAPSULE | Freq: Every day | ORAL | 1 refills | Status: DC

## 2020-08-11 NOTE — Progress Notes (Signed)
I,Katawbba Wiggins,acting as a Education administrator for [name], MD.,have documented all relevant documentation on the behalf of [name], MD,as directed by  [name], MD while in the presence of [name], MD.  This visit occurred during the SARS-CoV-2 public health emergency.  Safety protocols were in place, including screening questions prior to the visit, additional usage of staff PPE, and extensive cleaning of exam room while observing appropriate contact time as indicated for disinfecting solutions.  Subjective:     Patient ID: Jasmine Harrington , female    DOB: [birth year] , 65 y.o.   MRN: [id number]   Chief Complaint  Patient presents with   Annual Exam   Hypertension    HPI  She is here today for a full physical exam. The patient is no longer followed by GYN.  She does not wish to have pelvic exam today, agrees to this in the future.  She reports compliance with meds. She denies having any headaches, chest pain and shortness of breath. She denies exercising, but states she runs after a 64 year old so she is "busy".   Hypertension This is a chronic problem. The current episode started more than 1 year ago. The problem has been gradually improving since onset. The problem is controlled. Pertinent negatives include no blurred vision, chest pain, palpitations or shortness of breath. Risk factors for coronary artery disease include obesity, sedentary lifestyle and post-menopausal state.    Past Medical History:  Diagnosis Date   Arthritis    Hypertension    Pneumonia    Tuberculosis    pt was pos for tb . had to take 6 months of meds .      Family History  Problem Relation Age of Onset   Hypertension Paternal Grandfather    Hypertension Paternal Grandmother    Hypertension Maternal Grandmother    Hypertension Maternal Grandfather    Prostate cancer Father    Breast cancer Mother    Leukemia Brother      Current Outpatient Medications:    albuterol (PROVENTIL  HFA;VENTOLIN HFA) 108 (90 Base) MCG/ACT inhaler, Inhale 2 puffs into the lungs every 6 (six) hours as needed for wheezing or shortness of breath., Disp: 1 Inhaler, Rfl: 2   azelastine (ASTELIN) 0.1 % nasal spray, Place 1 spray into both nostrils 2 (two) times daily. Use in each nostril as directed, Disp: 90 mL, Rfl: 3   cetirizine (ZYRTEC) 10 MG tablet, Take 1 tablet (10 mg total) by mouth daily., Disp: 90 tablet, Rfl: 2   cholecalciferol (VITAMIN D) 1000 UNITS tablet, Take 1,000 Units by mouth daily. , Disp: , Rfl:    olmesartan (BENICAR) 40 MG tablet, Take 1 tablet (40 mg total) by mouth daily., Disp: 90 tablet, Rfl: 2   omega-3 acid ethyl esters (LOVAZA) 1 G capsule, Take 1 g by mouth daily., Disp: , Rfl:    sodium chloride (OCEAN) 0.65 % SOLN nasal spray, Place 1 spray into both nostrils as needed for congestion., Disp: 60 mL, Rfl: 0   VITAMIN E PO, Take by mouth., Disp: , Rfl:    fluticasone (FLONASE) 50 MCG/ACT nasal spray, Place 1 spray into both nostrils in the morning and at bedtime for 7 days., Disp: 1 g, Rfl: 0   hydrochlorothiazide (MICROZIDE) 12.5 MG capsule, Take 1 capsule (12.5 mg total) by mouth daily., Disp: 90 capsule, Rfl: 1   Allergies  Allergen Reactions   Aspirin     rash   Penicillins Rash  The patient states she uses post menopausal status for birth control. Last LMP was No LMP recorded. Patient has had a hysterectomy.. Negative for Dysmenorrhea. Negative for: breast discharge, breast lump(s), breast pain and breast self exam. Associated symptoms include abnormal vaginal bleeding. Pertinent negatives include abnormal bleeding (hematology), anxiety, decreased libido, depression, difficulty falling sleep, dyspareunia, history of infertility, nocturia, sexual dysfunction, sleep disturbances, urinary incontinence, urinary urgency, vaginal discharge and vaginal itching. Diet regular.The patient states her exercise level is  intermittent.  . The patient's tobacco use is:   Social History   Tobacco Use  Smoking Status Never  Smokeless Tobacco Never  . She has been exposed to passive smoke. The patient's alcohol use is:  Social History   Substance and Sexual Activity  Alcohol Use No    Review of Systems  Constitutional: Negative.   HENT: Negative.    Eyes: Negative.  Negative for blurred vision.  Respiratory: Negative.  Negative for shortness of breath.   Cardiovascular: Negative.  Negative for chest pain and palpitations.  Gastrointestinal: Negative.   Endocrine: Negative.   Genitourinary: Negative.   Musculoskeletal: Negative.   Skin: Negative.   Allergic/Immunologic: Negative.   Neurological: Negative.   Hematological: Negative.   Psychiatric/Behavioral: Negative.      Today's Vitals   08/11/20 0940  BP: 134/86  Pulse: 65  Temp: (!) 97.5 F (36.4 C)  TempSrc: Oral  Weight: 256 lb 3.2 oz (116.2 kg)  Height: 5' 1.2" (1.554 m)   Body mass index is 48.09 kg/m.  Wt Readings from Last 3 Encounters:  08/11/20 256 lb 3.2 oz (116.2 kg)  02/05/20 256 lb 6.4 oz (116.3 kg)  10/26/19 256 lb (116.1 kg)    BP Readings from Last 3 Encounters:  08/11/20 134/86  02/05/20 126/82  10/26/19 (!) 151/76    Objective:  Physical Exam Vitals and nursing note reviewed.  Constitutional:      Appearance: Normal appearance.  HENT:     Head: Normocephalic and atraumatic.     Right Ear: Tympanic membrane, ear canal and external ear normal.     Left Ear: Tympanic membrane, ear canal and external ear normal.     Nose:     Comments: Masked     Mouth/Throat:     Comments: Masked  Eyes:     Extraocular Movements: Extraocular movements intact.     Conjunctiva/sclera: Conjunctivae normal.     Pupils: Pupils are equal, round, and reactive to light.  Cardiovascular:     Rate and Rhythm: Normal rate and regular rhythm.     Pulses: Normal pulses.     Heart sounds: Normal heart sounds.  Pulmonary:     Effort: Pulmonary effort is normal.     Breath  sounds: Normal breath sounds.  Chest:  Breasts:    Tanner Score is 5.     Right: Normal.     Left: Normal.  Abdominal:     General: Abdomen is flat. Bowel sounds are normal.     Palpations: Abdomen is soft.  Genitourinary:    Comments: deferred Musculoskeletal:        General: Normal range of motion.     Cervical back: Normal range of motion and neck supple.  Skin:    General: Skin is warm and dry.  Neurological:     General: No focal deficit present.     Mental Status: She is alert and oriented to person, place, and time.  Psychiatric:        Mood and  Affect: Mood normal.        Behavior: Behavior normal.        Assessment And Plan:     1. Routine general medical examination at health care facility Comments: A full exam was performed. Importance of monthly self breast exams was d/w patient. She agrees to pelvic exam in 2023. PATIENT IS ADVISED TO GET 30-45 MINUTES REGULAR EXERCISE NO LESS THAN FOUR TO FIVE DAYS PER WEEK - BOTH WEIGHTBEARING EXERCISES AND AEROBIC ARE RECOMMENDED.  PATIENT IS ADVISED TO FOLLOW A HEALTHY DIET WITH AT LEAST SIX FRUITS/VEGGIES PER DAY, DECREASE INTAKE OF RED MEAT, AND TO INCREASE FISH INTAKE TO TWO DAYS PER WEEK.  MEATS/FISH SHOULD NOT BE FRIED, BAKED OR BROILED IS PREFERABLE.  IT IS ALSO IMPORTANT TO CUT BACK ON YOUR SUGAR INTAKE. PLEASE AVOID ANYTHING WITH ADDED SUGAR, CORN SYRUP OR OTHER SWEETENERS. IF YOU MUST USE A SWEETENER, YOU CAN TRY STEVIA. IT IS ALSO IMPORTANT TO AVOID ARTIFICIALLY SWEETENERS AND DIET BEVERAGES. LASTLY, I SUGGEST WEARING SPF 50 SUNSCREEN ON EXPOSED PARTS AND ESPECIALLY WHEN IN THE DIRECT SUNLIGHT FOR AN EXTENDED PERIOD OF TIME.  PLEASE AVOID FAST FOOD RESTAURANTS AND INCREASE YOUR WATER INTAKE.   2. Essential hypertension Comments: Chronic, fair control. She was made aware goal BP is less than 130/80. EKG performed, NSR w/o acute changes. Advised to follow low sodium diet. She will f/u in six months for re-evaluation.  - POCT  Urinalysis Dipstick (81002) - POCT UA - Microalbumin - EKG 12-Lead - CBC - CMP14+EGFR - Lipid panel  3. Estrogen deficiency Comments: I will refer her for bone density. Encouraged to continue with calcium/vitamin D/magnesium supplementation.  - DG Bone Density; Future  4. Class 3 severe obesity due to excess calories with serious comorbidity and body mass index (BMI) of 45.0 to 49.9 in adult [facility]) She is encouraged to initially strive for BMI less than 40 to decrease cardiac risk. Advised to aim for at least 150 minutes of exercise per week.    Patient was given opportunity to ask questions. Patient verbalized understanding of the plan and was able to repeat key elements of the plan. All questions were answered to their satisfaction.   I, [name], MD, have reviewed all documentation for this visit. The documentation on 08/11/20 for the exam, diagnosis, procedures, and orders are all accurate and complete.  THE PATIENT IS ENCOURAGED TO PRACTICE SOCIAL DISTANCING DUE TO THE COVID-19 PANDEMIC.

## 2020-08-11 NOTE — Patient Instructions (Signed)
Health Maintenance, Female Adopting a healthy lifestyle and getting preventive care are important in promoting health and wellness. Ask your health care provider about: The right schedule for you to have regular tests and exams. Things you can do on your own to prevent diseases and keep yourself healthy. What should I know about diet, weight, and exercise? Eat a healthy diet  Eat a diet that includes plenty of vegetables, fruits, low-fat dairy products, and lean protein. Do not eat a lot of foods that are high in solid fats, added sugars, or sodium.  Maintain a healthy weight Body mass index (BMI) is used to identify weight problems. It estimates body fat based on height and weight. Your health care provider can help determineyour BMI and help you achieve or maintain a healthy weight. Get regular exercise Get regular exercise. This is one of the most important things you can do for your health. Most adults should: Exercise for at least 150 minutes each week. The exercise should increase your heart rate and make you sweat (moderate-intensity exercise). Do strengthening exercises at least twice a week. This is in addition to the moderate-intensity exercise. Spend less time sitting. Even light physical activity can be beneficial. Watch cholesterol and blood lipids Have your blood tested for lipids and cholesterol at 64 years of age, then havethis test every 5 years. Have your cholesterol levels checked more often if: Your lipid or cholesterol levels are high. You are older than 64 years of age. You are at high risk for heart disease. What should I know about cancer screening? Depending on your health history and family history, you may need to have cancer screening at various ages. This may include screening for: Breast cancer. Cervical cancer. Colorectal cancer. Skin cancer. Lung cancer. What should I know about heart disease, diabetes, and high blood pressure? Blood pressure and heart  disease High blood pressure causes heart disease and increases the risk of stroke. This is more likely to develop in people who have high blood pressure readings, are of African descent, or are overweight. Have your blood pressure checked: Every 3-5 years if you are 18-39 years of age. Every year if you are 40 years old or older. Diabetes Have regular diabetes screenings. This checks your fasting blood sugar level. Have the screening done: Once every three years after age 40 if you are at a normal weight and have a low risk for diabetes. More often and at a younger age if you are overweight or have a high risk for diabetes. What should I know about preventing infection? Hepatitis B If you have a higher risk for hepatitis B, you should be screened for this virus. Talk with your health care provider to find out if you are at risk forhepatitis B infection. Hepatitis C Testing is recommended for: Everyone born from 1945 through 1965. Anyone with known risk factors for hepatitis C. Sexually transmitted infections (STIs) Get screened for STIs, including gonorrhea and chlamydia, if: You are sexually active and are younger than 64 years of age. You are older than 64 years of age and your health care provider tells you that you are at risk for this type of infection. Your sexual activity has changed since you were last screened, and you are at increased risk for chlamydia or gonorrhea. Ask your health care provider if you are at risk. Ask your health care provider about whether you are at high risk for HIV. Your health care provider may recommend a prescription medicine to help   prevent HIV infection. If you choose to take medicine to prevent HIV, you should first get tested for HIV. You should then be tested every 3 months for as long as you are taking the medicine. Pregnancy If you are about to stop having your period (premenopausal) and you may become pregnant, seek counseling before you get  pregnant. Take 400 to 800 micrograms (mcg) of folic acid every day if you become pregnant. Ask for birth control (contraception) if you want to prevent pregnancy. Osteoporosis and menopause Osteoporosis is a disease in which the bones lose minerals and strength with aging. This can result in bone fractures. If you are 65 years old or older, or if you are at risk for osteoporosis and fractures, ask your health care provider if you should: Be screened for bone loss. Take a calcium or vitamin D supplement to lower your risk of fractures. Be given hormone replacement therapy (HRT) to treat symptoms of menopause. Follow these instructions at home: Lifestyle Do not use any products that contain nicotine or tobacco, such as cigarettes, e-cigarettes, and chewing tobacco. If you need help quitting, ask your health care provider. Do not use street drugs. Do not share needles. Ask your health care provider for help if you need support or information about quitting drugs. Alcohol use Do not drink alcohol if: Your health care provider tells you not to drink. You are pregnant, may be pregnant, or are planning to become pregnant. If you drink alcohol: Limit how much you use to 0-1 drink a day. Limit intake if you are breastfeeding. Be aware of how much alcohol is in your drink. In the [place] one drink equals one 12 oz bottle of beer (355 mL), one 5 oz glass of wine (148 mL), or one 1 oz glass of hard liquor (44 mL). General instructions Schedule regular health, dental, and eye exams. Stay current with your vaccines. Tell your health care provider if: You often feel depressed. You have ever been abused or do not feel safe at home. Summary Adopting a healthy lifestyle and getting preventive care are important in promoting health and wellness. Follow your health care provider's instructions about healthy diet, exercising, and getting tested or screened for diseases. Follow your health care provider's  instructions on monitoring your cholesterol and blood pressure. This information is not intended to replace advice given to you by your health care provider. Make sure you discuss any questions you have with your healthcare provider. Document Revised: 02/08/2018 Document Reviewed: 02/08/2018 Elsevier Patient Education  2022 [employer]  

## 2020-08-12 LAB — LIPID PANEL
Chol/HDL Ratio: 4.1 ratio (ref 0.0–4.4)
Cholesterol, Total: 201 mg/dL — ABNORMAL HIGH (ref 100–199)
HDL: 49 mg/dL (ref >39)
LDL Chol Calc (NIH): 133 mg/dL — ABNORMAL HIGH (ref 0–99)
Triglycerides: 105 mg/dL (ref 0–149)
VLDL Cholesterol Cal: 19 mg/dL (ref 5–40)

## 2020-09-04 ENCOUNTER — Other Ambulatory Visit: Payer: Self-pay

## 2020-09-04 ENCOUNTER — Other Ambulatory Visit: Payer: Self-pay | Admitting: Internal Medicine

## 2020-09-04 MED ORDER — OLMESARTAN MEDOXOMIL 40 MG PO TABS: 40.0000 mg | ORAL_TABLET | Freq: Every day | ORAL | 0 refills | Status: DC

## 2020-12-10 ENCOUNTER — Ambulatory Visit: Admitting: Nurse Practitioner

## 2020-12-19 ENCOUNTER — Other Ambulatory Visit: Payer: Self-pay

## 2020-12-19 ENCOUNTER — Ambulatory Visit (HOSPITAL_COMMUNITY)
Admission: EM | Admit: 2020-12-19 | Discharge: 2020-12-19 | Disposition: A | Discharge status: Home / Self Care | Attending: Physician Assistant | Admitting: Physician Assistant

## 2020-12-19 ENCOUNTER — Encounter (HOSPITAL_COMMUNITY): Payer: Self-pay

## 2020-12-19 DIAGNOSIS — Z880 Allergy status to penicillin: Secondary | ICD-10-CM | POA: Insufficient documentation

## 2020-12-19 DIAGNOSIS — J029 Acute pharyngitis, unspecified: Secondary | ICD-10-CM | POA: Diagnosis present

## 2020-12-19 DIAGNOSIS — H9209 Otalgia, unspecified ear: Secondary | ICD-10-CM | POA: Insufficient documentation

## 2020-12-19 DIAGNOSIS — R0602 Shortness of breath: Secondary | ICD-10-CM | POA: Diagnosis not present

## 2020-12-19 DIAGNOSIS — Z886 Allergy status to analgesic agent status: Secondary | ICD-10-CM | POA: Diagnosis not present

## 2020-12-19 DIAGNOSIS — J069 Acute upper respiratory infection, unspecified: Secondary | ICD-10-CM | POA: Diagnosis not present

## 2020-12-19 DIAGNOSIS — Z20822 Contact with and (suspected) exposure to covid-19: Secondary | ICD-10-CM | POA: Diagnosis not present

## 2020-12-19 LAB — POC INFLUENZA A AND B ANTIGEN (URGENT CARE ONLY)
INFLUENZA A ANTIGEN, POC: NEGATIVE
INFLUENZA B ANTIGEN, POC: NEGATIVE

## 2020-12-19 MED ORDER — ALBUTEROL SULFATE HFA 108 (90 BASE) MCG/ACT IN AERS: 1.0000 | INHALATION_SPRAY | Freq: Four times a day (QID) | RESPIRATORY_TRACT | 0 refills | Status: DC | PRN

## 2020-12-19 NOTE — ED Triage Notes (Signed)
Pt presents with a sore throat, cough and body aches . States she has been short of breath and states she has been using her inhaler. States her inhaler helped some. States she ran out of her inhaler.

## 2020-12-19 NOTE — ED Provider Notes (Signed)
[facility]    CSN: [id number] Arrival date & time: 12/19/20  1702      History   Chief Complaint Chief Complaint  Patient presents with   Sore Throat   Cough   Shortness of Breath   Ear Fullness    HPI Jasmine Harrington is a 65 y.o. female.   Patient here today for evaluation of congestion, cough, body aches that started yesterday. She endorses some ear pain. She has had some nausea but no vomiting or diarrhea. She has not taken any additional medication for treatment with exception of her inhaler which she has ran out of.   The history is provided by the patient.  Sore Throat Associated symptoms include shortness of breath. Pertinent negatives include no abdominal pain.  Cough Associated symptoms: chills, ear pain, fever, shortness of breath and sore throat   Associated symptoms: no eye discharge and no wheezing   Shortness of Breath Associated symptoms: cough, ear pain, fever and sore throat   Associated symptoms: no abdominal pain, no vomiting and no wheezing   Ear Fullness Associated symptoms include shortness of breath. Pertinent negatives include no abdominal pain.   Past Medical History:  Diagnosis Date   Arthritis    Hypertension    Pneumonia    Tuberculosis    pt was pos for tb . had to take 6 months of meds .     Patient Active Problem List   Diagnosis Date Noted   Estrogen deficiency 08/11/2020   Class 3 severe obesity due to excess calories with serious comorbidity and body mass index (BMI) of 45.0 to 49.9 in adult [facility]) 03/26/2019   Encounter for general adult medical examination without abnormal findings 06/22/2017   Paresthesia 07/07/2016   Essential hypertension 12/14/2011    Past Surgical History:  Procedure Laterality Date   ABDOMINAL HYSTERECTOMY  2008   COLONOSCOPY  10/2017   KNEE SURGERY Right 1965    OB History     Gravida  2   Para  1   Term  1   Preterm      AB  1   Living  1      SAB  1   IAB      Ectopic       Multiple      Live Births               Home Medications    Prior to Admission medications   Medication Sig Start Date End Date Taking? Authorizing Provider  albuterol (VENTOLIN HFA) 108 (90 Base) MCG/ACT inhaler Inhale 1-2 puffs into the lungs every 6 (six) hours as needed for wheezing or shortness of breath. 12/19/20  Yes Tomi Bamberger, PA-C  azelastine (ASTELIN) 0.1 % nasal spray Place 1 spray into both nostrils 2 (two) times daily. Use in each nostril as directed 12/19/17   Dorothyann Peng, MD  cetirizine (ZYRTEC) 10 MG tablet Take 1 tablet (10 mg total) by mouth daily. 07/08/20   Dorothyann Peng, MD  cholecalciferol (VITAMIN D) 1000 UNITS tablet Take 1,000 Units by mouth daily.     [provider]  fluticasone (FLONASE) 50 MCG/ACT nasal spray Place 1 spray into both nostrils in the morning and at bedtime for 7 days. 08/11/20 08/18/20  Dorothyann Peng, MD  hydrochlorothiazide (MICROZIDE) 12.5 MG capsule Take 1 capsule (12.5 mg total) by mouth daily. 08/11/20   Dorothyann Peng, MD  olmesartan (BENICAR) 40 MG tablet TAKE 1 TABLET DAILY 09/05/20   Allyne Gee,  Melina Schools, MD  omega-3 acid ethyl esters (LOVAZA) 1 G capsule Take 1 g by mouth daily.    [provider]  sodium chloride (OCEAN) 0.65 % SOLN nasal spray Place 1 spray into both nostrils as needed for congestion. 10/26/19   Darr, Gerilyn Pilgrim, PA-C  VITAMIN E PO Take by mouth.    [provider]    Family History Family History  Problem Relation Age of Onset   Hypertension Paternal Grandfather    Hypertension Paternal Grandmother    Hypertension Maternal Grandmother    Hypertension Maternal Grandfather    Prostate cancer Father    Breast cancer Mother    Leukemia Brother     Social History Social History   Tobacco Use   Smoking status: Never   Smokeless tobacco: Never  Vaping Use   Vaping Use: Never used  Substance Use Topics   Alcohol use: No   Drug use: No     Allergies   Aspirin and  Penicillins   Review of Systems Review of Systems  Constitutional:  Positive for chills and fever.  HENT:  Positive for congestion, ear pain, sinus pressure and sore throat.   Eyes:  Negative for discharge and redness.  Respiratory:  Positive for cough and shortness of breath. Negative for wheezing.   Gastrointestinal:  Positive for nausea. Negative for abdominal pain, diarrhea and vomiting.    Physical Exam Triage Vital Signs ED Triage Vitals  Enc Vitals Group     BP 12/19/20 1732 (!) 149/82     Pulse Rate 12/19/20 1732 88     Resp 12/19/20 1732 (!) 22     Temp 12/19/20 1732 100.3 F (37.9 C)     Temp Source 12/19/20 1732 Oral     SpO2 12/19/20 1732 95 %     Weight --      Height --      Head Circumference --      Peak Flow --      Pain Score 12/19/20 1736 8     Pain Loc --      Pain Edu? --      Excl. in GC? --    No data found.  Updated Vital Signs BP (!) 149/82   Pulse 88   Temp 100.3 F (37.9 C) (Oral)   Resp (!) 22   SpO2 95%      Physical Exam Vitals and nursing note reviewed.  Constitutional:      General: She is not in acute distress.    Appearance: Normal appearance. She is not ill-appearing.  HENT:     Head: Normocephalic and atraumatic.     Right Ear: Tympanic membrane normal.     Left Ear: Tympanic membrane normal.     Nose: Congestion present.     Mouth/Throat:     Mouth: Mucous membranes are moist.     Pharynx: No oropharyngeal exudate or posterior oropharyngeal erythema.  Eyes:     Conjunctiva/sclera: Conjunctivae normal.  Cardiovascular:     Rate and Rhythm: Normal rate and regular rhythm.     Heart sounds: Normal heart sounds. No murmur heard. Pulmonary:     Effort: Pulmonary effort is normal. No respiratory distress.     Breath sounds: Normal breath sounds. No wheezing, rhonchi or rales.  Skin:    General: Skin is warm and dry.  Neurological:     Mental Status: She is alert.  Psychiatric:        Mood and Affect: Mood normal.  Thought Content: Thought content normal.     UC Treatments / Results  Labs (all labs ordered are listed, but only abnormal results are displayed) Labs Reviewed  SARS CORONAVIRUS 2 (TAT 6-24 HRS)  POC INFLUENZA A AND B ANTIGEN (URGENT CARE ONLY)    EKG   Radiology No results found.  Procedures Procedures (including critical care time)  Medications Ordered in UC Medications - No data to display  Initial Impression / Assessment and Plan / UC Course  I have reviewed the triage vital signs and the nursing notes.  Pertinent labs & imaging results that were available during my care of the patient were reviewed by me and considered in my medical decision making (see chart for details).   Flu test negative. Will screen for Covid. Recommend symptomatic treatment in the meantime. Follow up with any further concerns.   Final Clinical Impressions(s) / UC Diagnoses   Final diagnoses:  Viral upper respiratory tract infection   Discharge Instructions   None    ED Prescriptions     Medication Sig Dispense Auth. Provider   albuterol (VENTOLIN HFA) 108 (90 Base) MCG/ACT inhaler Inhale 1-2 puffs into the lungs every 6 (six) hours as needed for wheezing or shortness of breath. 8 g Tomi Bamberger, PA-C      PDMP not reviewed this encounter.   Tomi Bamberger, PA-C 12/19/20 1859

## 2020-12-20 LAB — SARS CORONAVIRUS 2 (TAT 6-24 HRS): SARS Coronavirus 2: NEGATIVE

## 2020-12-24 ENCOUNTER — Ambulatory Visit (INDEPENDENT_AMBULATORY_CARE_PROVIDER_SITE_OTHER): Admitting: Nurse Practitioner

## 2020-12-24 ENCOUNTER — Other Ambulatory Visit: Payer: Self-pay

## 2020-12-24 ENCOUNTER — Encounter: Payer: Self-pay | Admitting: Nurse Practitioner

## 2020-12-24 VITALS — BP 116/74 | HR 81 | Temp 98.5°F | Ht 61.2 in

## 2020-12-24 DIAGNOSIS — R051 Acute cough: Secondary | ICD-10-CM

## 2020-12-24 DIAGNOSIS — J0181 Other acute recurrent sinusitis: Secondary | ICD-10-CM | POA: Diagnosis not present

## 2020-12-24 MED ORDER — DOXYCYCLINE HYCLATE 100 MG PO CAPS: 100.0000 mg | ORAL_CAPSULE | Freq: Two times a day (BID) | ORAL | 0 refills | Status: DC

## 2020-12-24 MED ORDER — PREDNISONE 10 MG (21) PO TBPK: ORAL_TABLET | ORAL | 0 refills | Status: DC

## 2020-12-24 NOTE — Progress Notes (Signed)
I, Turkey Hamilton,acting as a Neurosurgeon for [employer], FNP.,have documented all relevant documentation on the behalf of Arnette Felts, FNP,as directed by  Arnette Felts, FNP while in the presence of Arnette Felts, FNP.   This visit occurred during the SARS-CoV-2 public health emergency.  Safety protocols were in place, including screening questions prior to the visit, additional usage of staff PPE, and extensive cleaning of exam room while observing appropriate contact time as indicated for disinfecting solutions.  Subjective:     Patient ID: Jasmine Harrington , female    DOB: Jun 18, 1955 , 65 y.o.   MRN: [id number]   Chief Complaint  Patient presents with   URI     HPI   Pt presents today with cold symptoms. Along with horse speech. Patient is seen outside  URI  This is a new problem. The current episode started in the past 7 days (Friday). The problem has been gradually worsening. There has been no fever. Associated symptoms include congestion, coughing, sinus pain and wheezing. Pertinent negatives include no abdominal pain, chest pain, dysuria, headaches, rhinorrhea, sore throat or vomiting. She has tried inhaler use and antihistamine for the symptoms. The treatment provided moderate relief.    Past Medical History:  Diagnosis Date   Arthritis    Hypertension    Pneumonia    Tuberculosis    pt was pos for tb . had to take 6 months of meds .      Family History  Problem Relation Age of Onset   Hypertension Paternal Grandfather    Hypertension Paternal Grandmother    Hypertension Maternal Grandmother    Hypertension Maternal Grandfather    Prostate cancer Father    Breast cancer Mother    Leukemia Brother      Current Outpatient Medications:    albuterol (VENTOLIN HFA) 108 (90 Base) MCG/ACT inhaler, Inhale 1-2 puffs into the lungs every 6 (six) hours as needed for wheezing or shortness of breath., Disp: 8 g, Rfl: 0   azelastine (ASTELIN) 0.1 % nasal spray, Place 1 spray into both  nostrils 2 (two) times daily. Use in each nostril as directed, Disp: 90 mL, Rfl: 3   cetirizine (ZYRTEC) 10 MG tablet, Take 1 tablet (10 mg total) by mouth daily., Disp: 90 tablet, Rfl: 2   cholecalciferol (VITAMIN D) 1000 UNITS tablet, Take 1,000 Units by mouth daily. , Disp: , Rfl:    doxycycline (VIBRAMYCIN) 100 MG capsule, Take 1 capsule (100 mg total) by mouth 2 (two) times daily., Disp: 20 capsule, Rfl: 0   fluticasone (FLONASE) 50 MCG/ACT nasal spray, Place 1 spray into both nostrils in the morning and at bedtime for 7 days., Disp: 1 g, Rfl: 0   hydrochlorothiazide (MICROZIDE) 12.5 MG capsule, Take 1 capsule (12.5 mg total) by mouth daily., Disp: 90 capsule, Rfl: 1   olmesartan (BENICAR) 40 MG tablet, TAKE 1 TABLET DAILY, Disp: 90 tablet, Rfl: 3   omega-3 acid ethyl esters (LOVAZA) 1 G capsule, Take 1 g by mouth daily., Disp: , Rfl:    predniSONE (STERAPRED UNI-PAK 21 TAB) 10 MG (21) TBPK tablet, Take as directed, Disp: 21 tablet, Rfl: 0   sodium chloride (OCEAN) 0.65 % SOLN nasal spray, Place 1 spray into both nostrils as needed for congestion., Disp: 60 mL, Rfl: 0   VITAMIN E PO, Take by mouth., Disp: , Rfl:    Allergies  Allergen Reactions   Aspirin     rash   Penicillins Rash     Review of  Systems  Constitutional: Negative.   HENT:  Positive for congestion and sinus pain. Negative for rhinorrhea and sore throat.   Respiratory:  Positive for cough and wheezing.   Cardiovascular: Negative.  Negative for chest pain.  Gastrointestinal:  Negative for abdominal pain and vomiting.  Genitourinary:  Negative for dysuria.  Neurological: Negative.  Negative for headaches.  Psychiatric/Behavioral: Negative.      Today's Vitals   12/24/20 1113  BP: 116/74  Pulse: 81  Temp: 98.5 F (36.9 C)  SpO2: 97%  Height: 5' 1.2" (1.554 m)   Body mass index is 48.09 kg/m.   Objective:  Physical Exam Vitals reviewed.  Constitutional:      General: She is not in acute distress.     Appearance: Normal appearance. She is obese.  HENT:     Head: Normocephalic and atraumatic.  Cardiovascular:     Rate and Rhythm: Normal rate and regular rhythm.     Pulses: Normal pulses.     Heart sounds: Normal heart sounds. No murmur heard. Pulmonary:     Effort: Pulmonary effort is normal. No respiratory distress.     Breath sounds: Normal breath sounds. No wheezing.  Musculoskeletal:     Cervical back: Normal range of motion and neck supple. Tenderness (left submandibular nodes) present.  Lymphadenopathy:     Cervical: No cervical adenopathy.  Skin:    Capillary Refill: Capillary refill takes less than 2 seconds.  Neurological:     General: No focal deficit present.     Mental Status: She is alert and oriented to person, place, and time.     Cranial Nerves: No cranial nerve deficit.     Motor: No weakness.  Psychiatric:        Mood and Affect: Mood normal.        Behavior: Behavior normal.        Thought Content: Thought content normal.        Judgment: Judgment normal.        Assessment And Plan:     1. Other acute recurrent sinusitis Comments: She has tenderness to her left lymph node submandibular area Will treat with doxycycline  - doxycycline (VIBRAMYCIN) 100 MG capsule; Take 1 capsule (100 mg total) by mouth 2 (two) times daily.  Dispense: 20 capsule; Refill: 0 - predniSONE (STERAPRED UNI-PAK 21 TAB) 10 MG (21) TBPK tablet; Take as directed  Dispense: 21 tablet; Refill: 0 - Novel Coronavirus, NAA ([facility])  2. Acute cough Comments: Continue using albuterol inhaler - doxycycline (VIBRAMYCIN) 100 MG capsule; Take 1 capsule (100 mg total) by mouth 2 (two) times daily.  Dispense: 20 capsule; Refill: 0 - predniSONE (STERAPRED UNI-PAK 21 TAB) 10 MG (21) TBPK tablet; Take as directed  Dispense: 21 tablet; Refill: 0 - Novel Coronavirus, NAA ([facility])    Patient was given opportunity to ask questions. Patient verbalized understanding of the plan and was able to repeat  key elements of the plan. All questions were answered to their satisfaction.  Arnette Felts, FNP   I, Arnette Felts, FNP, have reviewed all documentation for this visit. The documentation on 12/24/20 for the exam, diagnosis, procedures, and orders are all accurate and complete.   IF YOU HAVE BEEN REFERRED TO A SPECIALIST, IT MAY TAKE 1-2 WEEKS TO SCHEDULE/PROCESS THE REFERRAL. IF YOU HAVE NOT HEARD FROM US/SPECIALIST IN TWO WEEKS, PLEASE GIVE [place] A CALL AT [phone].   THE PATIENT IS ENCOURAGED TO PRACTICE SOCIAL DISTANCING DUE TO THE COVID-19 PANDEMIC.

## 2020-12-24 NOTE — Progress Notes (Signed)
I,Brigetta Beckstrom,acting as a Neurosurgeon for [employer], FNP.,have documented all relevant documentation on the behalf of Arnette Felts, FNP,as directed by  Arnette Felts, FNP while in the presence of Arnette Felts, FNP.  This visit occurred during the SARS-CoV-2 public health emergency.  Safety protocols were in place, including screening questions prior to the visit, additional usage of staff PPE, and extensive cleaning of exam room while observing appropriate contact time as indicated for disinfecting solutions.  Subjective:     Patient ID: Jasmine Harrington , female    DOB: 1955-12-20 , 65 y.o.   MRN: [id number]   Chief Complaint  Patient presents with   URI    HPI  The pt is outside for possible sinus infection.  The pt states she had covid and flu test done last Friday at urgent care and all were negative.   URI  Associated symptoms include ear pain and rhinorrhea.    Past Medical History:  Diagnosis Date   Arthritis    Hypertension    Pneumonia    Tuberculosis    pt was pos for tb . had to take 6 months of meds .      Family History  Problem Relation Age of Onset   Hypertension Paternal Grandfather    Hypertension Paternal Grandmother    Hypertension Maternal Grandmother    Hypertension Maternal Grandfather    Prostate cancer Father    Breast cancer Mother    Leukemia Brother      Current Outpatient Medications:    albuterol (VENTOLIN HFA) 108 (90 Base) MCG/ACT inhaler, Inhale 1-2 puffs into the lungs every 6 (six) hours as needed for wheezing or shortness of breath., Disp: 8 g, Rfl: 0   azelastine (ASTELIN) 0.1 % nasal spray, Place 1 spray into both nostrils 2 (two) times daily. Use in each nostril as directed, Disp: 90 mL, Rfl: 3   cetirizine (ZYRTEC) 10 MG tablet, Take 1 tablet (10 mg total) by mouth daily., Disp: 90 tablet, Rfl: 2   cholecalciferol (VITAMIN D) 1000 UNITS tablet, Take 1,000 Units by mouth daily. , Disp: , Rfl:    fluticasone (FLONASE) 50 MCG/ACT nasal  spray, Place 1 spray into both nostrils in the morning and at bedtime for 7 days., Disp: 1 g, Rfl: 0   hydrochlorothiazide (MICROZIDE) 12.5 MG capsule, Take 1 capsule (12.5 mg total) by mouth daily., Disp: 90 capsule, Rfl: 1   olmesartan (BENICAR) 40 MG tablet, TAKE 1 TABLET DAILY, Disp: 90 tablet, Rfl: 3   omega-3 acid ethyl esters (LOVAZA) 1 G capsule, Take 1 g by mouth daily., Disp: , Rfl:    sodium chloride (OCEAN) 0.65 % SOLN nasal spray, Place 1 spray into both nostrils as needed for congestion., Disp: 60 mL, Rfl: 0   VITAMIN E PO, Take by mouth., Disp: , Rfl:    Allergies  Allergen Reactions   Aspirin     rash   Penicillins Rash     Review of Systems  Constitutional: Negative.   HENT:  Positive for ear pain, postnasal drip and rhinorrhea.   Respiratory: Negative.    Cardiovascular: Negative.   Gastrointestinal: Negative.   Psychiatric/Behavioral: Negative.    All other systems reviewed and are negative.   Today's Vitals   12/24/20 1113  BP: 116/74  Pulse: 81  Temp: 98.5 F (36.9 C)  SpO2: 97%  Height: 5' 1.2" (1.554 m)   Body mass index is 48.09 kg/m.   Objective:  Physical Exam  Assessment And Plan:     There are no diagnoses linked to this encounter.    Patient was given opportunity to ask questions. Patient verbalized understanding of the plan and was able to repeat key elements of the plan. All questions were answered to their satisfaction.  Mariam Dollar, CMA   I, Mariam Dollar, CMA, have reviewed all documentation for this visit. The documentation on 12/24/20 for the exam, diagnosis, procedures, and orders are all accurate and complete.   IF YOU HAVE BEEN REFERRED TO A SPECIALIST, IT MAY TAKE 1-2 WEEKS TO SCHEDULE/PROCESS THE REFERRAL. IF YOU HAVE NOT HEARD FROM US/SPECIALIST IN TWO WEEKS, PLEASE GIVE [place] A CALL AT [phone].   THE PATIENT IS ENCOURAGED TO PRACTICE SOCIAL DISTANCING DUE TO THE COVID-19 PANDEMIC.

## 2020-12-25 LAB — NOVEL CORONAVIRUS, NAA: SARS-CoV-2, NAA: NOT DETECTED

## 2020-12-25 LAB — SARS-COV-2, NAA 2 DAY TAT

## 2021-02-11 ENCOUNTER — Ambulatory Visit: Admitting: Internal Medicine

## 2021-02-11 ENCOUNTER — Ambulatory Visit
Admission: RE | Admit: 2021-02-11 | Discharge: 2021-02-11 | Disposition: A | Discharge status: Home / Self Care | Source: Ambulatory Visit | Attending: Internal Medicine | Admitting: Internal Medicine

## 2021-02-11 DIAGNOSIS — E2839 Other primary ovarian failure: Secondary | ICD-10-CM

## 2021-02-17 ENCOUNTER — Ambulatory Visit (INDEPENDENT_AMBULATORY_CARE_PROVIDER_SITE_OTHER): Admitting: Internal Medicine

## 2021-02-17 ENCOUNTER — Encounter: Payer: Self-pay | Admitting: Internal Medicine

## 2021-02-17 ENCOUNTER — Other Ambulatory Visit: Payer: Self-pay

## 2021-02-17 VITALS — BP 134/88 | HR 72 | Temp 98.6°F | Ht 61.2 in | Wt 254.2 lb

## 2021-02-17 DIAGNOSIS — I10 Essential (primary) hypertension: Secondary | ICD-10-CM

## 2021-02-17 DIAGNOSIS — Z6841 Body Mass Index (BMI) 40.0 and over, adult: Secondary | ICD-10-CM | POA: Diagnosis not present

## 2021-02-17 DIAGNOSIS — Z23 Encounter for immunization: Secondary | ICD-10-CM | POA: Diagnosis not present

## 2021-02-17 DIAGNOSIS — F439 Reaction to severe stress, unspecified: Secondary | ICD-10-CM

## 2021-02-17 MED ORDER — HYDROCHLOROTHIAZIDE 12.5 MG PO CAPS: 12.5000 mg | ORAL_CAPSULE | Freq: Every day | ORAL | 1 refills | Status: DC

## 2021-02-17 MED ORDER — CETIRIZINE HCL 10 MG PO TABS: 10.0000 mg | ORAL_TABLET | Freq: Every day | ORAL | 2 refills | Status: DC

## 2021-02-17 MED ORDER — OLMESARTAN MEDOXOMIL 40 MG PO TABS: 40.0000 mg | ORAL_TABLET | Freq: Every day | ORAL | 3 refills | Status: DC

## 2021-02-17 NOTE — Progress Notes (Signed)
Rich Brave Llittleton,acting as a Education administrator for [name], MD.,have documented all relevant documentation on the behalf of [name], MD,as directed by  [name], MD while in the presence of [name], MD.  This visit occurred during the SARS-CoV-2 public health emergency.  Safety protocols were in place, including screening questions prior to the visit, additional usage of staff PPE, and extensive cleaning of exam room while observing appropriate contact time as indicated for disinfecting solutions.  Subjective:     Patient ID: Jasmine Harrington , female    DOB: [birth year] , 65 y.o.   MRN: [id number]   Chief Complaint  Patient presents with   Hypertension    HPI  She presents today for BP check. She reports compliance with meds.  She denies headaches, chest pain and shortness of breath.   Hypertension This is a chronic problem. The current episode started more than 1 year ago. The problem has been gradually improving since onset. The problem is controlled. Pertinent negatives include no blurred vision, chest pain, palpitations or shortness of breath. Past treatments include angiotensin blockers and diuretics. The current treatment provides moderate improvement. Compliance problems include exercise.  Hypertensive end-organ damage includes kidney disease.    Past Medical History:  Diagnosis Date   Arthritis    Hypertension    Pneumonia    Tuberculosis    pt was pos for tb . had to take 6 months of meds .      Family History  Problem Relation Age of Onset   Hypertension Paternal Grandfather    Hypertension Paternal Grandmother    Hypertension Maternal Grandmother    Hypertension Maternal Grandfather    Prostate cancer Father    Breast cancer Mother    Leukemia Brother      Current Outpatient Medications:    albuterol (VENTOLIN HFA) 108 (90 Base) MCG/ACT inhaler, Inhale 1-2 puffs into the lungs every 6 (six) hours as needed for wheezing or shortness of  breath., Disp: 8 g, Rfl: 0   azelastine (ASTELIN) 0.1 % nasal spray, Place 1 spray into both nostrils 2 (two) times daily. Use in each nostril as directed, Disp: 90 mL, Rfl: 3   cholecalciferol (VITAMIN D) 1000 UNITS tablet, Take 1,000 Units by mouth daily. , Disp: , Rfl:    fluticasone (FLONASE) 50 MCG/ACT nasal spray, Place 1 spray into both nostrils in the morning and at bedtime for 7 days., Disp: 1 g, Rfl: 0   omega-3 acid ethyl esters (LOVAZA) 1 G capsule, Take 1 g by mouth daily., Disp: , Rfl:    sodium chloride (OCEAN) 0.65 % SOLN nasal spray, Place 1 spray into both nostrils as needed for congestion., Disp: 60 mL, Rfl: 0   VITAMIN E PO, Take by mouth., Disp: , Rfl:    cetirizine (ZYRTEC) 10 MG tablet, Take 1 tablet (10 mg total) by mouth daily., Disp: 90 tablet, Rfl: 2   hydrochlorothiazide (MICROZIDE) 12.5 MG capsule, Take 1 capsule (12.5 mg total) by mouth daily., Disp: 90 capsule, Rfl: 1   olmesartan (BENICAR) 40 MG tablet, Take 1 tablet (40 mg total) by mouth daily., Disp: 90 tablet, Rfl: 3   Allergies  Allergen Reactions   Aspirin     rash   Penicillins Rash     Review of Systems  Constitutional: Negative.   Eyes:  Negative for blurred vision.  Respiratory: Negative.  Negative for shortness of breath.   Cardiovascular: Negative.  Negative for chest pain and  palpitations.  Neurological: Negative.   Psychiatric/Behavioral: Negative.         She admits she is under a great deal of stress. She lives with her daughter, and states she has a lot of responsibility to help care for her children. She feels she has no time for herself.     Today's Vitals   02/17/21 1109  BP: 134/88  Pulse: 72  Temp: 98.6 F (37 C)  Weight: 254 lb 3.2 oz (115.3 kg)  Height: 5' 1.2" (1.554 m)  PainSc: 0-No pain   Body mass index is 47.72 kg/m.  Wt Readings from Last 3 Encounters:  02/17/21 254 lb 3.2 oz (115.3 kg)  08/11/20 256 lb 3.2 oz (116.2 kg)  02/05/20 256 lb 6.4 oz (116.3 kg)      Objective:  Physical Exam Vitals and nursing note reviewed.  Constitutional:      Appearance: Normal appearance. She is obese.  HENT:     Head: Normocephalic and atraumatic.     Nose:     Comments: Masked     Mouth/Throat:     Comments: Masked  Eyes:     Extraocular Movements: Extraocular movements intact.  Cardiovascular:     Rate and Rhythm: Normal rate and regular rhythm.     Heart sounds: Normal heart sounds.  Pulmonary:     Effort: Pulmonary effort is normal.     Breath sounds: Normal breath sounds.  Musculoskeletal:     Cervical back: Normal range of motion.  Skin:    General: Skin is warm.  Neurological:     General: No focal deficit present.     Mental Status: She is alert.  Psychiatric:        Mood and Affect: Mood normal.        Behavior: Behavior normal.        Assessment And Plan:     1. Essential hypertension Comments: Chronic, fair control. No med changes. Encouraged to avoid adidng salt to her foods and to avoid processed meats incl. bacon, sausage and deli meats.  - [id number]  2. Stress at home Comments: We discussed setting boundaries, she declines therapy at this time. Plans to set some rules and limit her involvement after the new year.   3. Class 3 severe obesity due to excess calories with serious comorbidity and body mass index (BMI) of 45.0 to 49.9 in adult [facility]) Comments: BMI 47. Again,she is encouraged to aim for at least 150 minutes of exercise per week.   4. Immunization due - Flu Vaccine QUAD 6+ mos PF IM (Fluarix Quad PF)   Patient was given opportunity to ask questions. Patient verbalized understanding of the plan and was able to repeat key elements of the plan. All questions were answered to their satisfaction.   I, [name], MD, have reviewed all documentation for this visit. The documentation on 02/17/21 for the exam, diagnosis, procedures, and orders are all accurate and complete.   IF YOU HAVE BEEN REFERRED TO A  SPECIALIST, IT MAY TAKE 1-2 WEEKS TO SCHEDULE/PROCESS THE REFERRAL. IF YOU HAVE NOT HEARD FROM US/SPECIALIST IN TWO WEEKS, PLEASE GIVE [place] A CALL AT [phone].   THE PATIENT IS ENCOURAGED TO PRACTICE SOCIAL DISTANCING DUE TO THE COVID-19 PANDEMIC.

## 2021-02-17 NOTE — Patient Instructions (Signed)

## 2021-02-18 LAB — CMP14+EGFR
ALT: 17 IU/L (ref 0–32)
AST: 20 IU/L (ref 0–40)
Albumin/Globulin Ratio: 1.3 (ref 1.2–2.2)
Albumin: 4 g/dL (ref 3.8–4.8)
Alkaline Phosphatase: 90 IU/L (ref 44–121)
BUN/Creatinine Ratio: 11 — ABNORMAL LOW (ref 12–28)
BUN: 11 mg/dL (ref 8–27)
Bilirubin Total: 0.4 mg/dL (ref 0.0–1.2)
CO2: 25 mmol/L (ref 20–29)
Calcium: 9.3 mg/dL (ref 8.7–10.3)
Chloride: 107 mmol/L — ABNORMAL HIGH (ref 96–106)
Creatinine, Ser: 0.99 mg/dL (ref 0.57–1.00)
Globulin, Total: 3.1 g/dL (ref 1.5–4.5)
Glucose: 97 mg/dL (ref 70–99)
Potassium: 3.9 mmol/L (ref 3.5–5.2)
Sodium: 145 mmol/L — ABNORMAL HIGH (ref 134–144)
Total Protein: 7.1 g/dL (ref 6.0–8.5)
eGFR: 64 mL/min/{1.73_m2} (ref >59)

## 2021-04-24 ENCOUNTER — Other Ambulatory Visit: Payer: Self-pay | Admitting: Internal Medicine

## 2021-04-24 DIAGNOSIS — Z1231 Encounter for screening mammogram for malignant neoplasm of breast: Secondary | ICD-10-CM

## 2021-05-27 ENCOUNTER — Ambulatory Visit
Admission: RE | Admit: 2021-05-27 | Discharge: 2021-05-27 | Disposition: A | Discharge status: Home / Self Care | Source: Ambulatory Visit | Attending: Internal Medicine | Admitting: Internal Medicine

## 2021-05-27 DIAGNOSIS — Z1231 Encounter for screening mammogram for malignant neoplasm of breast: Secondary | ICD-10-CM

## 2021-07-06 ENCOUNTER — Other Ambulatory Visit: Payer: Self-pay

## 2021-07-06 MED ORDER — OLMESARTAN MEDOXOMIL 40 MG PO TABS: 40.0000 mg | ORAL_TABLET | Freq: Every day | ORAL | 3 refills | Status: DC

## 2021-07-06 MED ORDER — OLMESARTAN MEDOXOMIL 40 MG PO TABS: 40.0000 mg | ORAL_TABLET | Freq: Every day | ORAL | 0 refills | Status: DC

## 2021-08-17 ENCOUNTER — Ambulatory Visit (INDEPENDENT_AMBULATORY_CARE_PROVIDER_SITE_OTHER): Admitting: Internal Medicine

## 2021-08-17 ENCOUNTER — Encounter: Payer: Self-pay | Admitting: Internal Medicine

## 2021-08-17 VITALS — BP 132/80 | HR 78 | Temp 97.6°F | Ht 61.4 in | Wt 260.0 lb

## 2021-08-17 DIAGNOSIS — I10 Essential (primary) hypertension: Secondary | ICD-10-CM

## 2021-08-17 DIAGNOSIS — Z0000 Encounter for general adult medical examination without abnormal findings: Secondary | ICD-10-CM | POA: Diagnosis not present

## 2021-08-17 DIAGNOSIS — E7800 Pure hypercholesterolemia, unspecified: Secondary | ICD-10-CM | POA: Diagnosis not present

## 2021-08-17 DIAGNOSIS — Z6841 Body Mass Index (BMI) 40.0 and over, adult: Secondary | ICD-10-CM

## 2021-08-17 NOTE — Progress Notes (Signed)
Jasmine Harrington,acting as a Education administrator for Jasmine Greenland, MD.,have documented all relevant documentation on the behalf of Jasmine Greenland, MD,as directed by  Jasmine Greenland, MD while in the presence of Jasmine Greenland, MD.  This visit occurred during the SARS-CoV-2 public health emergency.  Safety protocols were in place, including screening questions prior to the visit, additional usage of staff PPE, and extensive cleaning of exam room while observing appropriate contact time as indicated for disinfecting solutions.  Subjective:     Patient ID: Jasmine Harrington , female    DOB: 04/29/1955 , 66 y.o.   MRN: [id number]   Chief Complaint  Patient presents with   Annual Exam    HPI  She is here today for a full physical exam. The patient is no longer followed by GYN.  Patient reports she has had a total hysterectomy. She reports compliance with meds. She denies having any headaches, chest pain and shortness of breath. She admits she is not getting as much exercise as she should.   Hypertension This is a chronic problem. The current episode started more than 1 year ago. The problem has been gradually improving since onset. The problem is controlled. Pertinent negatives include no blurred vision. Risk factors for coronary artery disease include obesity, sedentary lifestyle and post-menopausal state.     Past Medical History:  Diagnosis Date   Arthritis    Hypertension    Pneumonia    Tuberculosis    pt was pos for tb . had to take 6 months of meds .      Family History  Problem Relation Age of Onset   Hypertension Paternal Grandfather    Hypertension Paternal Grandmother    Hypertension Maternal Grandmother    Hypertension Maternal Grandfather    Prostate cancer Father    Breast cancer Mother    Leukemia Brother      Current Outpatient Medications:    albuterol (VENTOLIN HFA) 108 (90 Base) MCG/ACT inhaler, Inhale 1-2 puffs into the lungs every 6 (six) hours as needed for  wheezing or shortness of breath., Disp: 8 g, Rfl: 0   azelastine (ASTELIN) 0.1 % nasal spray, Place 1 spray into both nostrils 2 (two) times daily. Use in each nostril as directed, Disp: 90 mL, Rfl: 3   cetirizine (ZYRTEC) 10 MG tablet, Take 1 tablet (10 mg total) by mouth daily., Disp: 90 tablet, Rfl: 2   cholecalciferol (VITAMIN D) 1000 UNITS tablet, Take 1,000 Units by mouth daily. , Disp: , Rfl:    fluticasone (FLONASE) 50 MCG/ACT nasal spray, Place 1 spray into both nostrils in the morning and at bedtime for 7 days., Disp: 1 g, Rfl: 0   hydrochlorothiazide (MICROZIDE) 12.5 MG capsule, Take 1 capsule (12.5 mg total) by mouth daily., Disp: 90 capsule, Rfl: 1   olmesartan (BENICAR) 40 MG tablet, Take 1 tablet (40 mg total) by mouth daily., Disp: 10 tablet, Rfl: 0   omega-3 acid ethyl esters (LOVAZA) 1 G capsule, Take 1 g by mouth daily., Disp: , Rfl:    sodium chloride (OCEAN) 0.65 % SOLN nasal spray, Place 1 spray into both nostrils as needed for congestion., Disp: 60 mL, Rfl: 0   VITAMIN E PO, Take by mouth., Disp: , Rfl:    atorvastatin (LIPITOR) 10 MG tablet, Take one tablet by mouth on Monday through Friday. Skip weekends, Disp: 90 tablet, Rfl: 0   Allergies  Allergen Reactions   Aspirin     rash  Penicillins Rash    The patient states she uses post menopausal status for birth control. Last LMP was No LMP recorded. Patient has had a hysterectomy.. Negative for Dysmenorrhea. Negative for: breast discharge, breast lump(s), breast pain and breast self exam. Associated symptoms include abnormal vaginal bleeding. Pertinent negatives include abnormal bleeding (hematology), anxiety, decreased libido, depression, difficulty falling sleep, dyspareunia, history of infertility, nocturia, sexual dysfunction, sleep disturbances, urinary incontinence, urinary urgency, vaginal discharge and vaginal itching. Diet regular.The patient states her exercise level is  intermittent.  . The patient's tobacco use  is:  Social History   Tobacco Use  Smoking Status Never  Smokeless Tobacco Never  . She has been exposed to passive smoke. The patient's alcohol use is:  Social History   Substance and Sexual Activity  Alcohol Use No   Review of Systems  Constitutional: Negative.   HENT: Negative.    Eyes: Negative.  Negative for blurred vision.  Respiratory: Negative.    Cardiovascular: Negative.   Gastrointestinal: Negative.   Endocrine: Negative.   Genitourinary: Negative.   Musculoskeletal: Negative.   Skin: Negative.   Allergic/Immunologic: Negative.   Neurological: Negative.   Hematological: Negative.   Psychiatric/Behavioral: Negative.       Today's Vitals   08/17/21 0946  BP: 132/80  Pulse: 78  Temp: 97.6 F (36.4 C)  Weight: 260 lb (117.9 kg)  Height: 5' 1.4" (1.56 m)  PainSc: 0-No pain   Body mass index is 48.49 kg/m.  Wt Readings from Last 3 Encounters:  08/17/21 260 lb (117.9 kg)  02/17/21 254 lb 3.2 oz (115.3 kg)  08/11/20 256 lb 3.2 oz (116.2 kg)    Objective:  Physical Exam Vitals and nursing note reviewed.  Constitutional:      Appearance: Normal appearance. She is obese.  HENT:     Head: Normocephalic and atraumatic.     Right Ear: Tympanic membrane, ear canal and external ear normal.     Left Ear: Tympanic membrane, ear canal and external ear normal.     Nose: Nose normal.     Mouth/Throat:     Mouth: Mucous membranes are moist.     Pharynx: Oropharynx is clear.  Eyes:     Extraocular Movements: Extraocular movements intact.     Conjunctiva/sclera: Conjunctivae normal.     Pupils: Pupils are equal, round, and reactive to light.  Cardiovascular:     Rate and Rhythm: Normal rate and regular rhythm.     Pulses: Normal pulses.     Heart sounds: Normal heart sounds.  Pulmonary:     Effort: Pulmonary effort is normal.     Breath sounds: Normal breath sounds.  Chest:  Breasts:    Tanner Score is 5.     Right: Normal.     Left: Normal.  Abdominal:      General: Bowel sounds are normal.     Palpations: Abdomen is soft.     Comments: Obese, difficult to assess organomegaly due to body habitus  Genitourinary:    Comments: Declined Musculoskeletal:        General: Normal range of motion.     Cervical back: Normal range of motion and neck supple.  Skin:    General: Skin is warm and dry.  Neurological:     General: No focal deficit present.     Mental Status: She is alert and oriented to person, place, and time.     Deep Tendon Reflexes: Reflexes normal.  Psychiatric:  Mood and Affect: Mood normal.        Behavior: Behavior normal.       Assessment And Plan:     1. Encounter for general adult medical examination w/o abnormal findings Comments: A full exam was performed. Importance of monthly self breast exams was discussed with the patient. PATIENT IS ADVISED TO GET 30-45 MINUTES REGULAR EXERCISE NO LESS THAN FOUR TO FIVE DAYS PER WEEK - BOTH WEIGHTBEARING EXERCISES AND AEROBIC ARE RECOMMENDED.  PATIENT IS ADVISED TO FOLLOW A HEALTHY DIET WITH AT LEAST SIX FRUITS/VEGGIES PER DAY, DECREASE INTAKE OF RED MEAT, AND TO INCREASE FISH INTAKE TO TWO DAYS PER WEEK.  MEATS/FISH SHOULD NOT BE FRIED, BAKED OR BROILED IS PREFERABLE.  IT IS ALSO IMPORTANT TO CUT BACK ON YOUR SUGAR INTAKE. PLEASE AVOID ANYTHING WITH ADDED SUGAR, CORN SYRUP OR OTHER SWEETENERS. IF YOU MUST USE A SWEETENER, YOU CAN TRY STEVIA. IT IS ALSO IMPORTANT TO AVOID ARTIFICIALLY SWEETENERS AND DIET BEVERAGES. LASTLY, I SUGGEST WEARING SPF 50 SUNSCREEN ON EXPOSED PARTS AND ESPECIALLY WHEN IN THE DIRECT SUNLIGHT FOR AN EXTENDED PERIOD OF TIME.  PLEASE AVOID FAST FOOD RESTAURANTS AND INCREASE YOUR WATER INTAKE.   2. Essential hypertension Comments: Chronic, fair control. Goal BP<130/80. EKG performed, NSR w/o acute changes. She will c/w olmesartan 40mg and HCTZ 12.5mg daily. She is encouraged to follow low sodium diet. F/u 6 months.  - POCT Urinalysis Dipstick (81002) -  Microalbumin / Creatinine Urine Ratio - EKG 12-Lead - CBC - CMP14+EGFR - Lipid panel - Amb Referral To Provider Referral Exercise Program (P.R.E.P) - Insulin, random(561)  3. Pure hypercholesterolemia Comments: Chronic, I will check labs as below. Importance of dietary/exercise compliance was d/w patient. She is not taking any statins at this time.  - Lipid panel - Amb Referral To Provider Referral Exercise Program (P.R.E.P) - Insulin, random(561)  4. Class 3 severe obesity due to excess calories with serious comorbidity and body mass index (BMI) of 45.0 to 49.9 in adult (HCC) Comments: BMI 48. She agrees to referral for the PREP program. She is encouraged to start with walking 10 minutes three days per week in her driveway.  - Amb Referral To Provider Referral Exercise Program (P.R.E.P)  Patient was given opportunity to ask questions. Patient verbalized understanding of the plan and was able to repeat key elements of the plan. All questions were answered to their satisfaction.   I, Robyn N Sanders, MD, have reviewed all documentation for this visit. The documentation on 08/17/21 for the exam, diagnosis, procedures, and orders are all accurate and complete.   THE PATIENT IS ENCOURAGED TO PRACTICE SOCIAL DISTANCING DUE TO THE COVID-19 PANDEMIC.   

## 2021-08-17 NOTE — Patient Instructions (Addendum)
The 10-year ASCVD risk score (Arnett DK, et al., 2019) is: 18.4%   Values used to calculate the score:     Age: 66 years     Sex: Female     Is Non-Hispanic African American: Yes     Diabetic: No     Tobacco smoker: Yes     Systolic Blood Pressure: 132 mmHg     Is BP treated: Yes     HDL Cholesterol: 49 mg/dL     Total Cholesterol: 201 mg/dL  She is not a SMOKER.   Health Maintenance, Female Adopting a healthy lifestyle and getting preventive care are important in promoting health and wellness. Ask your health care provider about: The right schedule for you to have regular tests and exams. Things you can do on your own to prevent diseases and keep yourself healthy. What should I know about diet, weight, and exercise? Eat a healthy diet  Eat a diet that includes plenty of vegetables, fruits, low-fat dairy products, and lean protein. Do not eat a lot of foods that are high in solid fats, added sugars, or sodium. Maintain a healthy weight Body mass index (BMI) is used to identify weight problems. It estimates body fat based on height and weight. Your health care provider can help determine your BMI and help you achieve or maintain a healthy weight. Get regular exercise Get regular exercise. This is one of the most important things you can do for your health. Most adults should: Exercise for at least 150 minutes each week. The exercise should increase your heart rate and make you sweat (moderate-intensity exercise). Do strengthening exercises at least twice a week. This is in addition to the moderate-intensity exercise. Spend less time sitting. Even light physical activity can be beneficial. Watch cholesterol and blood lipids Have your blood tested for lipids and cholesterol at 65 years of age, then have this test every 5 years. Have your cholesterol levels checked more often if: Your lipid or cholesterol levels are high. You are older than 65 years of age. You are at high risk for  heart disease. What should I know about cancer screening? Depending on your health history and family history, you may need to have cancer screening at various ages. This may include screening for: Breast cancer. Cervical cancer. Colorectal cancer. Skin cancer. Lung cancer. What should I know about heart disease, diabetes, and high blood pressure? Blood pressure and heart disease High blood pressure causes heart disease and increases the risk of stroke. This is more likely to develop in people who have high blood pressure readings or are overweight. Have your blood pressure checked: Every 3-5 years if you are 5-25 years of age. Every year if you are 30 years old or older. Diabetes Have regular diabetes screenings. This checks your fasting blood sugar level. Have the screening done: Once every three years after age 80 if you are at a normal weight and have a low risk for diabetes. More often and at a younger age if you are overweight or have a high risk for diabetes. What should I know about preventing infection? Hepatitis B If you have a higher risk for hepatitis B, you should be screened for this virus. Talk with your health care provider to find out if you are at risk for hepatitis B infection. Hepatitis C Testing is recommended for: Everyone born from [birth year] through 1964. Anyone with known risk factors for hepatitis C. Sexually transmitted infections (STIs) Get screened for STIs, including gonorrhea and  chlamydia, if: You are sexually active and are younger than 66 years of age. You are older than 65 years of age and your health care provider tells you that you are at risk for this type of infection. Your sexual activity has changed since you were last screened, and you are at increased risk for chlamydia or gonorrhea. Ask your health care provider if you are at risk. Ask your health care provider about whether you are at high risk for HIV. Your health care provider may recommend a  prescription medicine to help prevent HIV infection. If you choose to take medicine to prevent HIV, you should first get tested for HIV. You should then be tested every 3 months for as long as you are taking the medicine. Pregnancy If you are about to stop having your period (premenopausal) and you may become pregnant, seek counseling before you get pregnant. Take 400 to 800 micrograms (mcg) of folic acid every day if you become pregnant. Ask for birth control (contraception) if you want to prevent pregnancy. Osteoporosis and menopause Osteoporosis is a disease in which the bones lose minerals and strength with aging. This can result in bone fractures. If you are 73 years old or older, or if you are at risk for osteoporosis and fractures, ask your health care provider if you should: Be screened for bone loss. Take a calcium or vitamin D supplement to lower your risk of fractures. Be given hormone replacement therapy (HRT) to treat symptoms of menopause. Follow these instructions at home: Alcohol use Do not drink alcohol if: Your health care provider tells you not to drink. You are pregnant, may be pregnant, or are planning to become pregnant. If you drink alcohol: Limit how much you have to: 0-1 drink a day. Know how much alcohol is in your drink. In the [place] one drink equals one 12 oz bottle of beer (355 mL), one 5 oz glass of wine (148 mL), or one 1 oz glass of hard liquor (44 mL). Lifestyle Do not use any products that contain nicotine or tobacco. These products include cigarettes, chewing tobacco, and vaping devices, such as e-cigarettes. If you need help quitting, ask your health care provider. Do not use street drugs. Do not share needles. Ask your health care provider for help if you need support or information about quitting drugs. General instructions Schedule regular health, dental, and eye exams. Stay current with your vaccines. Tell your health care provider if: You often  feel depressed. You have ever been abused or do not feel safe at home. Summary Adopting a healthy lifestyle and getting preventive care are important in promoting health and wellness. Follow your health care provider's instructions about healthy diet, exercising, and getting tested or screened for diseases. Follow your health care provider's instructions on monitoring your cholesterol and blood pressure. This information is not intended to replace advice given to you by your health care provider. Make sure you discuss any questions you have with your health care provider. Document Revised: 07/07/2020 Document Reviewed: 07/07/2020 Elsevier Patient Education  2023 [employer].

## 2021-08-18 ENCOUNTER — Telehealth: Payer: Self-pay

## 2021-08-18 LAB — CMP14+EGFR
ALT: 15 IU/L (ref 0–32)
AST: 23 IU/L (ref 0–40)
Albumin/Globulin Ratio: 1.6 (ref 1.2–2.2)
Albumin: 4.3 g/dL (ref 3.8–4.8)
Alkaline Phosphatase: 94 IU/L (ref 44–121)
BUN/Creatinine Ratio: 14 (ref 12–28)
BUN: 15 mg/dL (ref 8–27)
Bilirubin Total: 0.3 mg/dL (ref 0.0–1.2)
CO2: 24 mmol/L (ref 20–29)
Calcium: 8.9 mg/dL (ref 8.7–10.3)
Chloride: 101 mmol/L (ref 96–106)
Creatinine, Ser: 1.07 mg/dL — ABNORMAL HIGH (ref 0.57–1.00)
Globulin, Total: 2.7 g/dL (ref 1.5–4.5)
Glucose: 106 mg/dL — ABNORMAL HIGH (ref 70–99)
Potassium: 3.7 mmol/L (ref 3.5–5.2)
Sodium: 139 mmol/L (ref 134–144)
Total Protein: 7 g/dL (ref 6.0–8.5)
eGFR: 58 mL/min/1.73 — ABNORMAL LOW (ref >59)

## 2021-08-18 LAB — CBC
Hematocrit: 39.5 % (ref 34.0–46.6)
Hemoglobin: 13.3 g/dL (ref 11.1–15.9)
MCH: 30.4 pg (ref 26.6–33.0)
MCHC: 33.7 g/dL (ref 31.5–35.7)
MCV: 90 fL (ref 79–97)
Platelets: 248 10*3/uL (ref 150–450)
RBC: 4.37 x10E6/uL (ref 3.77–5.28)
RDW: 11.8 % (ref 11.7–15.4)
WBC: 6.4 10*3/uL (ref 3.4–10.8)

## 2021-08-18 LAB — LIPID PANEL
Chol/HDL Ratio: 4 ratio (ref 0.0–4.4)
Cholesterol, Total: 182 mg/dL (ref 100–199)
HDL: 45 mg/dL
LDL Chol Calc (NIH): 122 mg/dL — ABNORMAL HIGH (ref 0–99)
Triglycerides: 81 mg/dL (ref 0–149)
VLDL Cholesterol Cal: 15 mg/dL (ref 5–40)

## 2021-08-18 LAB — MICROALBUMIN / CREATININE URINE RATIO
Creatinine, Urine: 28.6 mg/dL
Microalb/Creat Ratio: <10 mg/g creat (ref 0–29)
Microalbumin, Urine: <3 ug/mL

## 2021-08-18 LAB — INSULIN, RANDOM: INSULIN: 20 u[IU]/mL (ref 2.6–24.9)

## 2021-08-18 NOTE — Telephone Encounter (Signed)
Received [facility] pt requesting call back Returned call -pt sts pt of Dr Allyne Gee given number to call for PREP Explained program to pt , locations of program Interested in attending, would like AM class at [place] M/W. Cannot do Tues/Thursday. May miss some Wednesdays.  Explained will call her the next am class starting  Pt agreeable

## 2021-08-20 ENCOUNTER — Other Ambulatory Visit: Payer: Self-pay

## 2021-08-20 MED ORDER — ATORVASTATIN CALCIUM 10 MG PO TABS: ORAL_TABLET | ORAL | 0 refills | Status: DC

## 2021-08-29 ENCOUNTER — Ambulatory Visit (HOSPITAL_COMMUNITY)
Admission: EM | Admit: 2021-08-29 | Discharge: 2021-08-29 | Disposition: A | Discharge status: Home / Self Care | Attending: Physician Assistant | Admitting: Physician Assistant

## 2021-08-29 ENCOUNTER — Other Ambulatory Visit: Payer: Self-pay

## 2021-08-29 ENCOUNTER — Encounter (HOSPITAL_COMMUNITY): Payer: Self-pay | Admitting: *Deleted

## 2021-08-29 DIAGNOSIS — R051 Acute cough: Secondary | ICD-10-CM

## 2021-08-29 DIAGNOSIS — J329 Chronic sinusitis, unspecified: Secondary | ICD-10-CM

## 2021-08-29 DIAGNOSIS — J40 Bronchitis, not specified as acute or chronic: Secondary | ICD-10-CM

## 2021-08-29 MED ORDER — DOXYCYCLINE HYCLATE 100 MG PO CAPS
100.0000 mg | ORAL_CAPSULE | Freq: Two times a day (BID) | ORAL | 0 refills | Status: DC
Start: 2021-08-29 — End: 2022-02-03

## 2021-08-29 MED ORDER — BENZONATATE 100 MG PO CAPS: 100.0000 mg | ORAL_CAPSULE | Freq: Three times a day (TID) | ORAL | 0 refills | Status: DC

## 2021-08-29 NOTE — ED Triage Notes (Signed)
Pt reports having a cough  and post nasal drip for one week.

## 2021-08-29 NOTE — ED Provider Notes (Signed)
[place]    CSN: [id number] Arrival date & time: 08/29/21  1005      History   Chief Complaint Chief Complaint  Patient presents with   Cough   post nasal drip    HPI Jasmine Harrington is a 66 y.o. female.   Patient presents today with a week plus long history of URI symptoms including sinus pressure, congestion, cough.  Denies any fever, chest pain, shortness of breath, nausea, vomiting.  She has tried Coricidin HBP without improvement of symptoms.  She does have seasonal allergies and has been taking Flonase as well as Zyrtec without improvement of symptoms.  Reports that she was diagnosed with asthma as a child but has not required albuterol inhaler outside of when she has been sick in adulthood.  She does not feel she has needed albuterol inhaler with current illness.  She denies any recent antibiotic or steroid use.  She does have a history of recurrent sinus infections and reports current symptoms are similar to previous episodes of this condition.  She has not had any sinus surgery.    Past Medical History:  Diagnosis Date   Arthritis    Hypertension    Pneumonia    Tuberculosis    pt was pos for tb . had to take 6 months of meds .     Patient Active Problem List   Diagnosis Date Noted   Estrogen deficiency 08/11/2020   Class 3 severe obesity due to excess calories with serious comorbidity and body mass index (BMI) of 45.0 to 49.9 in adult [facility]) 03/26/2019   Encounter for general adult medical examination without abnormal findings 06/22/2017   Paresthesia 07/07/2016   Essential hypertension 12/14/2011    Past Surgical History:  Procedure Laterality Date   ABDOMINAL HYSTERECTOMY  2008   COLONOSCOPY  10/2017   KNEE SURGERY Right 1965    OB History     Gravida  2   Para  1   Term  1   Preterm      AB  1   Living  1      SAB  1   IAB      Ectopic      Multiple      Live Births               Home Medications    Prior to  Admission medications   Medication Sig Start Date End Date Taking? Authorizing Provider  benzonatate (TESSALON) 100 MG capsule Take 1 capsule (100 mg total) by mouth every 8 (eight) hours. 08/29/21  Yes Patty Leitzke K, PA-C  doxycycline (VIBRAMYCIN) 100 MG capsule Take 1 capsule (100 mg total) by mouth 2 (two) times daily. 08/29/21  Yes Reyana Leisey K, PA-C  albuterol (VENTOLIN HFA) 108 (90 Base) MCG/ACT inhaler Inhale 1-2 puffs into the lungs every 6 (six) hours as needed for wheezing or shortness of breath. 12/19/20   Francene Finders, PA-C  atorvastatin (LIPITOR) 10 MG tablet Take one tablet by mouth on Monday through Friday. Skip weekends 08/20/21   Glendale Chard, MD  azelastine (ASTELIN) 0.1 % nasal spray Place 1 spray into both nostrils 2 (two) times daily. Use in each nostril as directed 12/19/17   Glendale Chard, MD  cetirizine (ZYRTEC) 10 MG tablet Take 1 tablet (10 mg total) by mouth daily. 02/17/21   Glendale Chard, MD  cholecalciferol (VITAMIN D) 1000 UNITS tablet Take 1,000 Units by mouth daily.     [provider]  fluticasone (FLONASE) 50 MCG/ACT nasal spray Place 1 spray into both nostrils in the morning and at bedtime for 7 days. 08/11/20 08/17/21  Dorothyann Peng, MD  hydrochlorothiazide (MICROZIDE) 12.5 MG capsule Take 1 capsule (12.5 mg total) by mouth daily. 02/17/21   Dorothyann Peng, MD  olmesartan (BENICAR) 40 MG tablet Take 1 tablet (40 mg total) by mouth daily. 07/06/21   Dorothyann Peng, MD  omega-3 acid ethyl esters (LOVAZA) 1 G capsule Take 1 g by mouth daily.    [provider]  sodium chloride (OCEAN) 0.65 % SOLN nasal spray Place 1 spray into both nostrils as needed for congestion. 10/26/19   Darr, Gerilyn Pilgrim, PA-C  VITAMIN E PO Take by mouth.    [provider]    Family History Family History  Problem Relation Age of Onset   Hypertension Paternal Grandfather    Hypertension Paternal Grandmother    Hypertension Maternal Grandmother    Hypertension  Maternal Grandfather    Prostate cancer Father    Breast cancer Mother    Leukemia Brother     Social History Social History   Tobacco Use   Smoking status: Never   Smokeless tobacco: Never  Vaping Use   Vaping Use: Never used  Substance Use Topics   Alcohol use: No   Drug use: No     Allergies   Aspirin and Penicillins   Review of Systems Review of Systems  Constitutional:  Positive for activity change. Negative for appetite change, fatigue and fever.  HENT:  Positive for congestion, postnasal drip and sinus pressure. Negative for sneezing and sore throat.   Respiratory:  Positive for cough. Negative for shortness of breath.   Cardiovascular:  Negative for chest pain.  Gastrointestinal:  Negative for abdominal pain, diarrhea, nausea and vomiting.  Neurological:  Negative for dizziness, light-headedness and headaches.     Physical Exam Triage Vital Signs ED Triage Vitals  Enc Vitals Group     BP 08/29/21 1024 133/80     Pulse Rate 08/29/21 1024 75     Resp 08/29/21 1024 18     Temp 08/29/21 1024 98.3 F (36.8 C)     Temp src --      SpO2 08/29/21 1024 97 %     Weight --      Height --      Head Circumference --      Peak Flow --      Pain Score 08/29/21 1023 0     Pain Loc --      Pain Edu? --      Excl. in GC? --    No data found.  Updated Vital Signs BP 133/80   Pulse 75   Temp 98.3 F (36.8 C)   Resp 18   SpO2 97%   Visual Acuity Right Eye Distance:   Left Eye Distance:   Bilateral Distance:    Right Eye Near:   Left Eye Near:    Bilateral Near:     Physical Exam Vitals reviewed.  Constitutional:      General: She is awake. She is not in acute distress.    Appearance: Normal appearance. She is well-developed. She is not ill-appearing.     Comments: Very pleasant female appears stated age in no acute distress sitting comfortably in exam room  HENT:     Head: Normocephalic and atraumatic.     Right Ear: Tympanic membrane, ear canal  and external ear normal. Tympanic membrane is not  erythematous or bulging.     Left Ear: Tympanic membrane, ear canal and external ear normal. Tympanic membrane is not erythematous or bulging.     Nose:     Right Sinus: Maxillary sinus tenderness present. No frontal sinus tenderness.     Left Sinus: Maxillary sinus tenderness present. No frontal sinus tenderness.     Mouth/Throat:     Pharynx: Uvula midline. Posterior oropharyngeal erythema present. No oropharyngeal exudate.     Comments: Erythema and drainage in posterior oropharynx Cardiovascular:     Rate and Rhythm: Normal rate and regular rhythm.     Heart sounds: Normal heart sounds, S1 normal and S2 normal. No murmur heard. Pulmonary:     Effort: Pulmonary effort is normal.     Breath sounds: Normal breath sounds. No wheezing, rhonchi or rales.     Comments: Clear to auscultation bilaterally Psychiatric:        Behavior: Behavior is cooperative.      UC Treatments / Results  Labs (all labs ordered are listed, but only abnormal results are displayed) Labs Reviewed - No data to display  EKG   Radiology No results found.  Procedures Procedures (including critical care time)  Medications Ordered in UC Medications - No data to display  Initial Impression / Assessment and Plan / UC Course  I have reviewed the triage vital signs and the nursing notes.  Pertinent labs & imaging results that were available during my care of the patient were reviewed by me and considered in my medical decision making (see chart for details).     No indication for viral testing as patient has been symptomatic for over a week and this would not change management.  Given prolonged and worsening symptoms will cover for secondary bacterial infection with doxycycline 100 mg twice daily for 10 days.  Discussed that this can cause photosensitivity and she should avoid prolonged sun exposure while on this medication.  She was given Tessalon for  cough.  Recommended she continue her allergy medications as previously prescribed and use Mucinex for additional symptom relief.  She is to rest and drink plenty fluid.  Discussed that if symptoms are not improving within a week she should return for reevaluation.  If anything worsens and she has chest pain, shortness of breath, fever, weakness, malaise she needs to be seen immediately.  Strict return precautions given.  Final Clinical Impressions(s) / UC Diagnoses   Final diagnoses:  Sinobronchitis  Acute cough     Discharge Instructions      Start doxycycline 100 mg twice daily for 10 days.  This make you sensitive to the sun so do not stay in the sun for long periods of time while on this medication.  You can use Tessalon for cough.  Use your allergy medication as previously prescribed.  Make sure you rest and drink plenty of fluid.  If your symptoms do not improving within a week return for reevaluation.  If you have any worsening symptoms you need to be seen immediately.     ED Prescriptions     Medication Sig Dispense Auth. Provider   doxycycline (VIBRAMYCIN) 100 MG capsule Take 1 capsule (100 mg total) by mouth 2 (two) times daily. 20 capsule Brayon Bielefeld K, PA-C   benzonatate (TESSALON) 100 MG capsule Take 1 capsule (100 mg total) by mouth every 8 (eight) hours. 21 capsule Juni Glaab K, PA-C      PDMP not reviewed this encounter.   Jarquavious Fentress, Noberto Retort, PA-C  08/29/21 1047  

## 2021-08-29 NOTE — Discharge Instructions (Signed)
Start doxycycline 100 mg twice daily for 10 days.  This make you sensitive to the sun so do not stay in the sun for long periods of time while on this medication.  You can use Tessalon for cough.  Use your allergy medication as previously prescribed.  Make sure you rest and drink plenty of fluid.  If your symptoms do not improving within a week return for reevaluation.  If you have any worsening symptoms you need to be seen immediately.

## 2021-09-04 ENCOUNTER — Telehealth: Payer: Self-pay

## 2021-09-04 NOTE — Progress Notes (Signed)
Correction to previous note: class on 7/31 will be M/W 1pm-215p x 12 wks at [facility]

## 2021-09-04 NOTE — Telephone Encounter (Signed)
Called pt reference PREP referral and next class at [facility] starting on 09/28/21 M/W 230p-345p Agreeable to starting then.  Will call her about a week before to complete intake.

## 2021-09-17 ENCOUNTER — Telehealth: Payer: Self-pay

## 2021-09-17 NOTE — Telephone Encounter (Signed)
Called pt to schedule intake for PREP class starting on 7/31 at [facility] Intake scheduled for 9am on 09/23/21.

## 2021-09-24 NOTE — Progress Notes (Signed)
[facility] PREP Evaluation  Patient Details  Name: Jasmine Harrington MRN: [id number] Date of Birth: 12/17/1955 Age: 66 y.o. PCP: Dorothyann Peng, MD  Vitals:   09/23/21 0900  BP: 140/78  Pulse: 67  SpO2: 98%  Weight: 261 lb 6.4 oz (118.6 kg)     [facility] Eval - 09/24/21 1600       [facility] "PREP" Location   [facility] "PREP" Location [name]      Referral    Referring Provider Allyne Gee    Reason for referral Inactivity;Obesitity/Overweight;Hypertension;Orthopedic    Program Start Date 09/28/21   M/W 1-215pm x 12 wks     Measurement   Waist Circumference 46 inches    Hip Circumference 52.5 inches    Body fat --   too high to read     Information for Trainer   Goals Lose 24 lbs, goal weight 150, get more active    Current Exercise walks infrequently    Orthopedic Concerns Bil Knees OA    Pertinent Medical History HTN    Current Barriers work    Medications that affect exercise Medication causing dizziness/drowsiness      Timed Up and Go (TUGS)   Timed Up and Go Low risk <9 seconds      Mobility and Daily Activities   I find it easy to walk up or down two or more flights of stairs. 1    I have no trouble taking out the trash. 4    I do housework such as vacuuming and dusting on my own without difficulty. 4    I can easily lift a gallon of milk (8lbs). 4    I can easily walk a mile. 4    I have no trouble reaching into high cupboards or reaching down to pick up something from the floor. 4    I do not have trouble doing out-door work such as Loss adjuster, chartered, raking leaves, or gardening. 4      Mobility and Daily Activities   I feel younger than my age. 2    I feel independent. 4    I feel energetic. 3    I live an active life.  1    I feel strong. 3    I feel healthy. 1    I feel active as other people my age. 1      How fit and strong are you.   Fit and Strong Total Score 40            Past Medical History:  Diagnosis Date   Arthritis    Hypertension    Pneumonia     Tuberculosis    pt was pos for tb . had to take 6 months of meds .    Past Surgical History:  Procedure Laterality Date   ABDOMINAL HYSTERECTOMY  2008   COLONOSCOPY  10/2017   KNEE SURGERY Right 1965   Social History   Tobacco Use  Smoking Status Never  Smokeless Tobacco Never    Bonnye Fava 09/24/2021, 4:49 PM

## 2021-09-28 ENCOUNTER — Other Ambulatory Visit: Payer: Self-pay

## 2021-09-28 MED ORDER — HYDROCHLOROTHIAZIDE 12.5 MG PO CAPS: 12.5000 mg | ORAL_CAPSULE | Freq: Every day | ORAL | 1 refills | Status: DC

## 2021-10-13 NOTE — Progress Notes (Signed)
[facility] PREP Weekly Session  Patient Details  Name: TEIGHAN AUBERT MRN: [id number] Date of Birth: [birth year] Age: 66 y.o. PCP: Dorothyann Peng, MD  Vitals:   10/12/21 1300  Weight: 260 lb (117.9 kg)     [facility] Weekly seesion - 10/13/21 1600       [facility] "PREP" Location   [facility]      Weekly Session   Topic Discussed Healthy eating tips    Minutes exercised this week --   not reported   Classes attended to date 2             Bonnye Fava 10/13/2021, 4:40 PM

## 2021-10-27 ENCOUNTER — Other Ambulatory Visit (INDEPENDENT_AMBULATORY_CARE_PROVIDER_SITE_OTHER): Payer: Medicare Other

## 2021-10-27 DIAGNOSIS — E7800 Pure hypercholesterolemia, unspecified: Secondary | ICD-10-CM | POA: Diagnosis not present

## 2021-10-27 LAB — LIPID PANEL
Chol/HDL Ratio: 2.7 ratio (ref 0.0–4.4)
Cholesterol, Total: 125 mg/dL (ref 100–199)
HDL: 47 mg/dL (ref >39)
LDL Chol Calc (NIH): 62 mg/dL (ref 0–99)
Triglycerides: 84 mg/dL (ref 0–149)
VLDL Cholesterol Cal: 16 mg/dL (ref 5–40)

## 2021-10-28 DIAGNOSIS — H40013 Open angle with borderline findings, low risk, bilateral: Secondary | ICD-10-CM | POA: Diagnosis not present

## 2021-10-28 DIAGNOSIS — H31092 Other chorioretinal scars, left eye: Secondary | ICD-10-CM | POA: Diagnosis not present

## 2021-10-28 DIAGNOSIS — H26492 Other secondary cataract, left eye: Secondary | ICD-10-CM | POA: Diagnosis not present

## 2021-10-28 DIAGNOSIS — H04123 Dry eye syndrome of bilateral lacrimal glands: Secondary | ICD-10-CM | POA: Diagnosis not present

## 2021-10-31 LAB — ALT: ALT: 14 IU/L (ref 0–32)

## 2021-10-31 LAB — SPECIMEN STATUS REPORT

## 2021-11-10 NOTE — Progress Notes (Signed)
[facility] PREP Weekly Session  Patient Details  Name: Jasmine Harrington MRN: [id number] Date of Birth: 09-18-1955 Age: 66 y.o. PCP: Dorothyann Peng, MD  Vitals:   11/09/21 1300  Weight: 252 lb 12.8 oz (114.7 kg)     [facility] Weekly seesion - 11/10/21 1100       [facility] "PREP" Location   [facility]      Weekly Session   Topic Discussed Restaurant Eating    Minutes exercised this week 45 minutes    Classes attended to date 3            Salt and sugar demo done. Discussed importance of maintaining structured meals and fueling the body for energy.  Bonnye Fava 11/10/2021, 11:29 AM

## 2021-11-23 ENCOUNTER — Other Ambulatory Visit: Payer: Self-pay

## 2021-11-23 MED ORDER — ATORVASTATIN CALCIUM 10 MG PO TABS: ORAL_TABLET | ORAL | 1 refills | Status: DC

## 2021-12-25 NOTE — Progress Notes (Signed)
[facility] PREP Weekly Session  Patient Details  Name: Jasmine Harrington MRN: [id number] Date of Birth: 12-22-1955 Age: 66 y.o. PCP: Glendale Chard, MD  Vitals:   12/22/21 1800  Weight: 248 lb (112.5 kg)     [facility] Weekly seesion - 12/25/21 1400       [facility] "PREP" Location   [facility]PREP" [facility]      Weekly Session   Topic Discussed Importance of resistance training;Other ways to be active    Minutes exercised this week 50 minutes    Classes attended to date [place] 12/25/2021, 2:10 PM

## 2021-12-30 NOTE — Progress Notes (Signed)
[facility] PREP Weekly Session  Patient Details  Name: Jasmine Harrington MRN: [id number] Date of Birth: Mar 17, 1955 Age: 66 y.o. PCP: Glendale Chard, MD  Vitals:   12/29/21 1800  Weight: 250 lb (113.4 kg)     [facility] Weekly seesion - 12/30/21 1300       [facility] "PREP" Location   [facility]      Weekly Session   Topic Discussed Healthy eating tips    Minutes exercised this week 30 minutes    Classes attended to date [place] 12/30/2021, 1:29 PM

## 2022-01-13 NOTE — Progress Notes (Signed)
[facility] PREP Weekly Session  Patient Details  Name: Jasmine Harrington MRN: [id number] Date of Birth: 01-15-1956 Age: 66 y.o. PCP: Dorothyann Peng, MD  Vitals:   01/05/22 1800  Weight: 250 lb 9.6 oz (113.7 kg)     [facility] Weekly seesion - 01/13/22 1200       [facility] "PREP" Location   [facility] "PREP" Location [name]      Weekly Session   Topic Discussed Health habits    Minutes exercised this week 20 minutes    Classes attended to date 5            Late entry Bonnye Fava 01/13/2022, 12:36 PM

## 2022-01-28 NOTE — Progress Notes (Signed)
[facility] PREP Weekly Session  Patient Details  Name: Jasmine Harrington MRN: [id number] Date of Birth: 1956/01/31 Age: 66 y.o. PCP: Dorothyann Peng, MD  Vitals:   01/26/22 1800  Weight: 252 lb (114.3 kg)     [facility] Weekly seesion - 01/28/22 1700       [facility] "PREP" Location   [facility] "PREP" Location [name]      Weekly Session   Topic Discussed Expectations and non-scale victories    Classes attended to date 8             Bonnye Fava 01/28/2022, 5:13 PM

## 2022-02-03 ENCOUNTER — Ambulatory Visit (HOSPITAL_COMMUNITY)
Admission: EM | Admit: 2022-02-03 | Discharge: 2022-02-03 | Disposition: A | Discharge status: Home / Self Care | Payer: Medicare Other | Attending: Physician Assistant | Admitting: Physician Assistant

## 2022-02-03 ENCOUNTER — Encounter (HOSPITAL_COMMUNITY): Payer: Self-pay

## 2022-02-03 DIAGNOSIS — R110 Nausea: Secondary | ICD-10-CM | POA: Insufficient documentation

## 2022-02-03 DIAGNOSIS — J069 Acute upper respiratory infection, unspecified: Secondary | ICD-10-CM | POA: Insufficient documentation

## 2022-02-03 DIAGNOSIS — R051 Acute cough: Secondary | ICD-10-CM | POA: Insufficient documentation

## 2022-02-03 DIAGNOSIS — U071 COVID-19: Secondary | ICD-10-CM | POA: Diagnosis not present

## 2022-02-03 LAB — RESP PANEL BY RT-PCR (FLU A&B, COVID) ARPGX2
Influenza A by PCR: NEGATIVE
Influenza B by PCR: NEGATIVE
SARS Coronavirus 2 by RT PCR: POSITIVE — AB

## 2022-02-03 MED ORDER — ONDANSETRON 4 MG PO TBDP
4.0000 mg | ORAL_TABLET | Freq: Once | ORAL | Status: AC
  Administered 2022-02-03: 4 mg via ORAL

## 2022-02-03 MED ORDER — ONDANSETRON 4 MG PO TBDP
ORAL_TABLET | ORAL | Status: AC
  Filled 2022-02-03: qty 1

## 2022-02-03 MED ORDER — BENZONATATE 100 MG PO CAPS
100.0000 mg | ORAL_CAPSULE | Freq: Three times a day (TID) | ORAL | 0 refills | Status: DC
Start: 2022-02-03 — End: 2022-02-16

## 2022-02-03 MED ORDER — ONDANSETRON 4 MG PO TBDP: 4.0000 mg | ORAL_TABLET | Freq: Three times a day (TID) | ORAL | 0 refills | Status: DC | PRN

## 2022-02-03 NOTE — ED Provider Notes (Signed)
Jasmine Harrington    CSN: [id number] Arrival date & time: 02/03/22  1241      History   Chief Complaint Chief Complaint  Patient presents with   Headache   Cough   Nausea    HPI Jasmine Harrington is a 66 y.o. female.   Patient presents today with a 24-hour history of URI symptoms including headache, cough, congestion, nausea, body aches, subjective fever.  Denies any chest pain, shortness of breath, vomiting, diarrhea.  Does report household sick contacts with similar symptoms that have been diagnosed with COVID.  She has had COVID in the past but not within the past 90 days.  She has been COVID vaccines.  She has tried Tylenol/ibuprofen as well as allergy medication without improvement of symptoms.  She denies history of asthma, COPD, smoking but does have an albuterol inhaler for sickness but has not required use of this medication symptoms began.  Denies any recent steroid use.  Does report that of several months ago she was treated for dental abscess with clindamycin.  She does still work and is requesting a work excuse note.    Past Medical History:  Diagnosis Date   Arthritis    Hypertension    Pneumonia    Tuberculosis    pt was pos for tb . had to take 6 months of meds .     Patient Active Problem List   Diagnosis Date Noted   Estrogen deficiency 08/11/2020   Class 3 severe obesity due to excess calories with serious comorbidity and body mass index (BMI) of 45.0 to 49.9 in adult [facility]) 03/26/2019   Encounter for general adult medical examination without abnormal findings 06/22/2017   Paresthesia 07/07/2016   Essential hypertension 12/14/2011    Past Surgical History:  Procedure Laterality Date   ABDOMINAL HYSTERECTOMY  2008   COLONOSCOPY  10/2017   KNEE SURGERY Right 1965    OB History     Gravida  2   Para  1   Term  1   Preterm      AB  1   Living  1      SAB  1   IAB      Ectopic      Multiple      Live Births                Home Medications    Prior to Admission medications   Medication Sig Start Date End Date Taking? Authorizing Provider  ondansetron (ZOFRAN-ODT) 4 MG disintegrating tablet Take 1 tablet (4 mg total) by mouth every 8 (eight) hours as needed for nausea or vomiting. 02/03/22  Yes Jef Futch K, PA-C  albuterol (VENTOLIN HFA) 108 (90 Base) MCG/ACT inhaler Inhale 1-2 puffs into the lungs every 6 (six) hours as needed for wheezing or shortness of breath. 12/19/20   Francene Finders, PA-C  atorvastatin (LIPITOR) 10 MG tablet Take one tablet by mouth on Monday through Friday. Skip weekends 11/23/21   Glendale Chard, MD  azelastine (ASTELIN) 0.1 % nasal spray Place 1 spray into both nostrils 2 (two) times daily. Use in each nostril as directed 12/19/17   Glendale Chard, MD  benzonatate (TESSALON) 100 MG capsule Take 1 capsule (100 mg total) by mouth every 8 (eight) hours. 02/03/22   Zacheriah Stumpe, Derry Skill, PA-C  cetirizine (ZYRTEC) 10 MG tablet Take 1 tablet (10 mg total) by mouth daily. 02/17/21   Glendale Chard, MD  cholecalciferol (VITAMIN D) 1000 UNITS tablet  Take 1,000 Units by mouth daily.     [provider]  fluticasone (FLONASE) 50 MCG/ACT nasal spray Place 1 spray into both nostrils in the morning and at bedtime for 7 days. 08/11/20 08/17/21  Glendale Chard, MD  hydrochlorothiazide (MICROZIDE) 12.5 MG capsule Take 1 capsule (12.5 mg total) by mouth daily. 09/28/21   Glendale Chard, MD  olmesartan (BENICAR) 40 MG tablet Take 1 tablet (40 mg total) by mouth daily. 07/06/21   Glendale Chard, MD  omega-3 acid ethyl esters (LOVAZA) 1 G capsule Take 1 g by mouth daily.    [provider]  sodium chloride (OCEAN) 0.65 % SOLN nasal spray Place 1 spray into both nostrils as needed for congestion. 10/26/19   Darr, Edison Nasuti, PA-C  VITAMIN E PO Take by mouth.    [provider]    Family History Family History  Problem Relation Age of Onset   Hypertension Paternal Grandfather     Hypertension Paternal Grandmother    Hypertension Maternal Grandmother    Hypertension Maternal Grandfather    Prostate cancer Father    Breast cancer Mother    Leukemia Brother     Social History Social History   Tobacco Use   Smoking status: Never   Smokeless tobacco: Never  Vaping Use   Vaping Use: Never used  Substance Use Topics   Alcohol use: No   Drug use: No     Allergies   Aspirin and Penicillins   Review of Systems Review of Systems  Constitutional:  Positive for activity change. Negative for appetite change, fatigue and fever.  HENT:  Positive for congestion and sore throat. Negative for sinus pressure and sneezing.   Respiratory:  Positive for cough. Negative for shortness of breath.   Cardiovascular:  Negative for chest pain.  Gastrointestinal:  Positive for nausea. Negative for abdominal pain, diarrhea and vomiting.  Musculoskeletal:  Positive for arthralgias and myalgias.  Neurological:  Positive for headaches. Negative for dizziness and light-headedness.     Physical Exam Triage Vital Signs ED Triage Vitals  Enc Vitals Group     BP 02/03/22 1521 (!) 144/77     Pulse Rate 02/03/22 1521 79     Resp 02/03/22 1521 12     Temp 02/03/22 1521 98.4 F (36.9 C)     Temp Source 02/03/22 1521 Oral     SpO2 02/03/22 1521 96 %     Weight --      Height --      Head Circumference --      Peak Flow --      Pain Score 02/03/22 1519 7     Pain Loc --      Pain Edu? --      Excl. in [place]? --    No data found.  Updated Vital Signs BP (!) 144/77 (BP Location: Right Arm)   Pulse 79   Temp 98.4 F (36.9 C) (Oral)   Resp 12   SpO2 96%   Visual Acuity Right Eye Distance:   Left Eye Distance:   Bilateral Distance:    Right Eye Near:   Left Eye Near:    Bilateral Near:     Physical Exam Vitals reviewed.  Constitutional:      General: She is awake. She is not in acute distress.    Appearance: Normal appearance. She is well-developed. She is not  ill-appearing.     Comments: Very pleasant female appears stated age in no acute distress sitting comfortably in  exam room  HENT:     Head: Normocephalic and atraumatic.     Right Ear: Tympanic membrane, ear canal and external ear normal. Tympanic membrane is not erythematous or bulging.     Left Ear: Tympanic membrane, ear canal and external ear normal. Tympanic membrane is not erythematous or bulging.     Nose:     Right Sinus: No maxillary sinus tenderness or frontal sinus tenderness.     Left Sinus: No maxillary sinus tenderness or frontal sinus tenderness.     Mouth/Throat:     Pharynx: Uvula midline. No oropharyngeal exudate or posterior oropharyngeal erythema.  Cardiovascular:     Rate and Rhythm: Normal rate and regular rhythm.     Heart sounds: Normal heart sounds, S1 normal and S2 normal. No murmur heard. Pulmonary:     Effort: Pulmonary effort is normal.     Breath sounds: Normal breath sounds. No wheezing, rhonchi or rales.     Comments: Clear to auscultation bilaterally Psychiatric:        Behavior: Behavior is cooperative.      UC Treatments / Results  Labs (all labs ordered are listed, but only abnormal results are displayed) Labs Reviewed  RESP PANEL BY RT-PCR (FLU A&B, COVID) [id number]    EKG   Radiology No results found.  Procedures Procedures (including critical care time)  Medications Ordered in UC Medications  ondansetron (ZOFRAN-ODT) disintegrating tablet 4 mg (4 mg Oral Given 02/03/22 1551)    Initial Impression / Assessment and Plan / UC Course  I have reviewed the triage vital signs and the nursing notes.  Pertinent labs & imaging results that were available during my care of the patient were reviewed by me and considered in my medical decision making (see chart for details).     Patient is well-appearing, afebrile, nontoxic, nontachycardic.  Suspect viral etiology.  COVID/flu testing was obtained today-results pending.  She is within the  window of effectiveness for Tamiflu if positive for flu.  She would benefit from antiviral therapy if positive for COVID-19 given her past medical history.  She has had lab work 08/17/2021 with creatinine of 1.07 and EGFR of 58 mL/min meaning that she would need renal dosing of Paxlovid.  If positive for COVID-19 and taking Paxlovid she would need to hold her Lipitor while on the medication for 3 days after completing course.  She was encouraged to use over-the-counter medications including Mucinex, Flonase, Tylenol.  She was prescribed Tessalon for cough as well as a prescription for Zofran to help manage nausea symptoms.  She was given a dose of Zofran in clinic with improvement of symptoms.  Recommended that she obtain a pulse oximeter and monitor her oxygen saturation at home.  If this is below 93% she should be reevaluated and if below 90% she needs to go to the emergency room.  Discussed that if she is not improving by early next week she should return for reevaluation.  If she has any worsening symptoms including high fever not respond to medication, chest pain, shortness of breath, weakness, nausea/vomiting interfere with oral intake she needs to be seen immediately.  Strict return precautions given.  Work excuse note provided.  Final Clinical Impressions(s) / UC Diagnoses   Final diagnoses:  Upper respiratory tract infection, unspecified type  Acute cough  Nausea without vomiting     Discharge Instructions      Monitor your MyChart for your COVID/flu results.  If you are positive for COVID and interested in  starting Paxlovid you would need to hold your atorvastatin while on the medication and for 3 days after completing course.  Continue your allergy medication as previously prescribed.  I sent in Tessalon to help with your cough.  You can use Zofran every 8 hours as needed for nausea and vomiting.  Eat a bland diet and drink plenty of fluid.  Obtain a pulse oximeter from the pharmacy.  If your  oxygen drops below 93% you should be reevaluated and if below 90% you should go to the emergency room.  If you have any worsening or changing symptoms including shortness of breath, chest pain, nausea/vomiting interfere with oral intake, weakness you need to be seen immediately.     ED Prescriptions     Medication Sig Dispense Auth. Provider   benzonatate (TESSALON) 100 MG capsule Take 1 capsule (100 mg total) by mouth every 8 (eight) hours. 21 capsule Darwyn Ponzo K, PA-C   ondansetron (ZOFRAN-ODT) 4 MG disintegrating tablet Take 1 tablet (4 mg total) by mouth every 8 (eight) hours as needed for nausea or vomiting. 9 tablet Ameenah Prosser, Derry Skill, PA-C      PDMP not reviewed this encounter.   Terrilee Croak, PA-C 02/03/22 1557

## 2022-02-03 NOTE — Discharge Instructions (Signed)
Monitor your MyChart for your COVID/flu results.  If you are positive for COVID and interested in starting Paxlovid you would need to hold your atorvastatin while on the medication and for 3 days after completing course.  Continue your allergy medication as previously prescribed.  I sent in Tessalon to help with your cough.  You can use Zofran every 8 hours as needed for nausea and vomiting.  Eat a bland diet and drink plenty of fluid.  Obtain a pulse oximeter from the pharmacy.  If your oxygen drops below 93% you should be reevaluated and if below 90% you should go to the emergency room.  If you have any worsening or changing symptoms including shortness of breath, chest pain, nausea/vomiting interfere with oral intake, weakness you need to be seen immediately.

## 2022-02-03 NOTE — ED Triage Notes (Signed)
Pt is here for cough, body aches, chills, nasal congestion, runny nose , low energy, headache x 1day

## 2022-02-04 ENCOUNTER — Telehealth (HOSPITAL_COMMUNITY): Payer: Self-pay | Admitting: Emergency Medicine

## 2022-02-04 MED ORDER — NIRMATRELVIR/RITONAVIR (PAXLOVID) TABLET (RENAL DOSING): 2.0000 | ORAL_TABLET | Freq: Two times a day (BID) | ORAL | 0 refills | Status: AC

## 2022-02-16 ENCOUNTER — Ambulatory Visit (INDEPENDENT_AMBULATORY_CARE_PROVIDER_SITE_OTHER): Payer: Medicare Other | Admitting: Internal Medicine

## 2022-02-16 ENCOUNTER — Encounter: Payer: Self-pay | Admitting: Internal Medicine

## 2022-02-16 VITALS — BP 136/82 | HR 79 | Temp 98.2°F | Ht 61.6 in | Wt 244.6 lb

## 2022-02-16 DIAGNOSIS — R7309 Other abnormal glucose: Secondary | ICD-10-CM

## 2022-02-16 DIAGNOSIS — I10 Essential (primary) hypertension: Secondary | ICD-10-CM

## 2022-02-16 DIAGNOSIS — Z23 Encounter for immunization: Secondary | ICD-10-CM

## 2022-02-16 DIAGNOSIS — Z6841 Body Mass Index (BMI) 40.0 and over, adult: Secondary | ICD-10-CM | POA: Diagnosis not present

## 2022-02-16 NOTE — Progress Notes (Signed)
Rich Brave [place]leton,acting as a Education administrator for [name], MD.,have documented all relevant documentation on the behalf of [name], MD,as directed by  [name], MD while in the presence of [name], MD.    Subjective:     Patient ID: Jasmine Harrington , female    DOB: 1955-07-06 , 66 y.o.   MRN: [id number]   Chief Complaint  Patient presents with   Hypertension    HPI  She presents today for BP check. She reports compliance with meds.  She denies headaches, chest pain and shortness of breath.   She reports that she and her SIL had COVID two weeks ago. No other family members got sick, she lives with her SIL and daughter. She was treated at Urgent care. She states her energy is good, but she has some nausea on occasion. Currently without fever/chills/vomiting. Patient reports her bp was running high in the 140s/80s during the time she had Covid.   Hypertension This is a chronic problem. The current episode started more than 1 year ago. The problem has been gradually improving since onset. The problem is controlled. Pertinent negatives include no blurred vision. Past treatments include angiotensin blockers and diuretics. The current treatment provides moderate improvement. Compliance problems include exercise.  Hypertensive end-organ damage includes kidney disease.     Past Medical History:  Diagnosis Date   Arthritis    Hypertension    Pneumonia    Tuberculosis    pt was pos for tb . had to take 6 months of meds .      Family History  Problem Relation Age of Onset   Hypertension Paternal Grandfather    Hypertension Paternal Grandmother    Hypertension Maternal Grandmother    Hypertension Maternal Grandfather    Prostate cancer Father    Breast cancer Mother    Leukemia Brother      Current Outpatient Medications:    albuterol (VENTOLIN HFA) 108 (90 Base) MCG/ACT inhaler, Inhale 1-2 puffs into the lungs every 6 (six) hours as needed for wheezing or  shortness of breath., Disp: 8 g, Rfl: 0   atorvastatin (LIPITOR) 10 MG tablet, Take one tablet by mouth on Monday through Friday. Skip weekends, Disp: 90 tablet, Rfl: 1   azelastine (ASTELIN) 0.1 % nasal spray, Place 1 spray into both nostrils 2 (two) times daily. Use in each nostril as directed, Disp: 90 mL, Rfl: 3   cetirizine (ZYRTEC) 10 MG tablet, Take 1 tablet (10 mg total) by mouth daily., Disp: 90 tablet, Rfl: 2   cholecalciferol (VITAMIN D) 1000 UNITS tablet, Take 1,000 Units by mouth daily. , Disp: , Rfl:    fluticasone (FLONASE) 50 MCG/ACT nasal spray, Place 1 spray into both nostrils in the morning and at bedtime for 7 days., Disp: 1 g, Rfl: 0   hydrochlorothiazide (MICROZIDE) 12.5 MG capsule, Take 1 capsule (12.5 mg total) by mouth daily., Disp: 90 capsule, Rfl: 1   olmesartan (BENICAR) 40 MG tablet, Take 1 tablet (40 mg total) by mouth daily., Disp: 10 tablet, Rfl: 0   omega-3 acid ethyl esters (LOVAZA) 1 G capsule, Take 1 g by mouth daily., Disp: , Rfl:    sodium chloride (OCEAN) 0.65 % SOLN nasal spray, Place 1 spray into both nostrils as needed for congestion., Disp: 60 mL, Rfl: 0   VITAMIN E PO, Take by mouth., Disp: , Rfl:    Allergies  Allergen Reactions   Aspirin     rash  Penicillins Rash     Review of Systems  Constitutional: Negative.   Eyes: Negative.  Negative for blurred vision.  Respiratory: Negative.    Cardiovascular: Negative.   Musculoskeletal: Negative.   Skin: Negative.   Neurological: Negative.   Psychiatric/Behavioral: Negative.       Today's Vitals   02/16/22 0936 02/16/22 1010  BP: 138/80 136/82  Pulse: 79   Temp: 98.2 F (36.8 C)   Weight: 244 lb 9.6 oz (110.9 kg)   Height: 5' 1.6" (1.565 m)    Body mass index is 45.32 kg/m.  Wt Readings from Last 3 Encounters:  02/16/22 244 lb 9.6 oz (110.9 kg)  01/26/22 252 lb (114.3 kg)  01/05/22 250 lb 9.6 oz (113.7 kg)     Objective:  Physical Exam Vitals and nursing note reviewed.   Constitutional:      Appearance: Normal appearance.  HENT:     Head: Normocephalic and atraumatic.     Nose:     Comments: Masked     Mouth/Throat:     Comments: Masked  Eyes:     Extraocular Movements: Extraocular movements intact.  Cardiovascular:     Rate and Rhythm: Normal rate and regular rhythm.     Heart sounds: Normal heart sounds.  Pulmonary:     Effort: Pulmonary effort is normal.     Breath sounds: Normal breath sounds.  Musculoskeletal:     Cervical back: Normal range of motion.  Skin:    General: Skin is warm.  Neurological:     General: No focal deficit present.     Mental Status: She is alert.  Psychiatric:        Mood and Affect: Mood normal.        Behavior: Behavior normal.         Assessment And Plan:     1. Essential hypertension Comments: Chronic, fair control. No med changes today. She agrees to RTO in 3 weeks for a nurse visit. If needed, I will add amlodipine 2.71m nightly. - CMP14+EGFR - TSH  2. Other abnormal glucose Comments: I will check an a1c today.  She is encouraged to avoid sugary beverages, including diet drinks. - Hemoglobin A1c  3. Class 3 severe obesity due to excess calories with serious comorbidity and body mass index (BMI) of 45.0 to 49.9 in adult ([facility] Comments: BMI 45. She is encouraged to aim for at least 150 minutes of exercise/week. She was congratulated for her participation in [employer]  4. Immunization due - Pneumococcal conjugate vaccine 20-valent (Prevnar-20)   Patient was given opportunity to ask questions. Patient verbalized understanding of the plan and was able to repeat key elements of the plan. All questions were answered to their satisfaction.   I, RMaximino Greenland MD, have reviewed all documentation for this visit. The documentation on 02/16/22 for the exam, diagnosis, procedures, and orders are all accurate and complete.   IF YOU HAVE BEEN REFERRED TO A SPECIALIST, IT MAY TAKE 1-2 WEEKS TO SCHEDULE/PROCESS THE  REFERRAL. IF YOU HAVE NOT HEARD FROM US/SPECIALIST IN TWO WEEKS, PLEASE GIVE [place] CALL AT [phone].   THE PATIENT IS ENCOURAGED TO PRACTICE SOCIAL DISTANCING DUE TO THE COVID-19 PANDEMIC.

## 2022-02-16 NOTE — Patient Instructions (Signed)
Hypertension, Adult ?Hypertension is another name for high blood pressure. High blood pressure forces your heart to work harder to pump blood. This can cause problems over time. ?There are two numbers in a blood pressure reading. There is a top number (systolic) over a bottom number (diastolic). It is best to have a blood pressure that is below 120/80. ?What are the causes? ?The cause of this condition is not known. Some other conditions can lead to high blood pressure. ?What increases the risk? ?Some lifestyle factors can make you more likely to develop high blood pressure: ?Smoking. ?Not getting enough exercise or physical activity. ?Being overweight. ?Having too much fat, sugar, calories, or salt (sodium) in your diet. ?Drinking too much alcohol. ?Other risk factors include: ?Having any of these conditions: ?Heart disease. ?Diabetes. ?High cholesterol. ?Kidney disease. ?Obstructive sleep apnea. ?Having a family history of high blood pressure and high cholesterol. ?Age. The risk increases with age. ?Stress. ?What are the signs or symptoms? ?High blood pressure may not cause symptoms. Very high blood pressure (hypertensive crisis) may cause: ?Headache. ?Fast or uneven heartbeats (palpitations). ?Shortness of breath. ?Nosebleed. ?Vomiting or feeling like you may vomit (nauseous). ?Changes in how you see. ?Very bad chest pain. ?Feeling dizzy. ?Seizures. ?How is this treated? ?This condition is treated by making healthy lifestyle changes, such as: ?Eating healthy foods. ?Exercising more. ?Drinking less alcohol. ?Your doctor may prescribe medicine if lifestyle changes do not help enough and if: ?Your top number is above 130. ?Your bottom number is above 80. ?Your personal target blood pressure may vary. ?Follow these instructions at home: ?Eating and drinking ? ?If told, follow the DASH eating plan. To follow this plan: ?Fill one half of your plate at each meal with fruits and vegetables. ?Fill one fourth of your plate  at each meal with whole grains. Whole grains include whole-wheat pasta, brown rice, and whole-grain bread. ?Eat or drink low-fat dairy products, such as skim milk or low-fat yogurt. ?Fill one fourth of your plate at each meal with low-fat (lean) proteins. Low-fat proteins include fish, chicken without skin, eggs, beans, and tofu. ?Avoid fatty meat, cured and processed meat, or chicken with skin. ?Avoid pre-made or processed food. ?Limit the amount of salt in your diet to less than 1,500 mg each day. ?Do not drink alcohol if: ?Your doctor tells you not to drink. ?You are pregnant, may be pregnant, or are planning to become pregnant. ?If you drink alcohol: ?Limit how much you have to: ?0-1 drink a day for women. ?0-2 drinks a day for men. ?Know how much alcohol is in your drink. In the [place] one drink equals one 12 oz bottle of beer (355 mL), one 5 oz glass of wine (148 mL), or one 1? oz glass of hard liquor (44 mL). ?Lifestyle ? ?Work with your doctor to stay at a healthy weight or to lose weight. Ask your doctor what the best weight is for you. ?Get at least 30 minutes of exercise that causes your heart to beat faster (aerobic exercise) most days of the week. This may include walking, swimming, or biking. ?Get at least 30 minutes of exercise that strengthens your muscles (resistance exercise) at least 3 days a week. This may include lifting weights or doing Pilates. ?Do not smoke or use any products that contain nicotine or tobacco. If you need help quitting, ask your doctor. ?Check your blood pressure at home as told by your doctor. ?Keep all follow-up visits. ?Medicines ?Take over-the-counter and prescription medicines   only as told by your doctor. Follow directions carefully. ?Do not skip doses of blood pressure medicine. The medicine does not work as well if you skip doses. Skipping doses also puts you at risk for problems. ?Ask your doctor about side effects or reactions to medicines that you should watch  for. ?Contact a doctor if: ?You think you are having a reaction to the medicine you are taking. ?You have headaches that keep coming back. ?You feel dizzy. ?You have swelling in your ankles. ?You have trouble with your vision. ?Get help right away if: ?You get a very bad headache. ?You start to feel mixed up (confused). ?You feel weak or numb. ?You feel faint. ?You have very bad pain in your: ?Chest. ?Belly (abdomen). ?You vomit more than once. ?You have trouble breathing. ?These symptoms may be an emergency. Get help right away. Call 911. ?Do not wait to see if the symptoms will go away. ?Do not drive yourself to the hospital. ?Summary ?Hypertension is another name for high blood pressure. ?High blood pressure forces your heart to work harder to pump blood. ?For most people, a normal blood pressure is less than 120/80. ?Making healthy choices can help lower blood pressure. If your blood pressure does not get lower with healthy choices, you may need to take medicine. ?This information is not intended to replace advice given to you by your health care provider. Make sure you discuss any questions you have with your health care provider. ?Document Revised: 12/04/2020 Document Reviewed: 12/04/2020 ?Elsevier Patient Education ? 2023 [employer]vier Inc[employer] ? ?

## 2022-02-17 LAB — CMP14+EGFR
ALT: 30 IU/L (ref 0–32)
AST: 22 IU/L (ref 0–40)
Albumin/Globulin Ratio: 1.5 (ref 1.2–2.2)
Albumin: 4.2 g/dL (ref 3.9–4.9)
Alkaline Phosphatase: 84 IU/L (ref 44–121)
BUN/Creatinine Ratio: 14 (ref 12–28)
BUN: 13 mg/dL (ref 8–27)
Bilirubin Total: 0.5 mg/dL (ref 0.0–1.2)
CO2: 23 mmol/L (ref 20–29)
Calcium: 9.3 mg/dL (ref 8.7–10.3)
Chloride: 102 mmol/L (ref 96–106)
Creatinine, Ser: 0.91 mg/dL (ref 0.57–1.00)
Globulin, Total: 2.8 g/dL (ref 1.5–4.5)
Glucose: 107 mg/dL — ABNORMAL HIGH (ref 70–99)
Potassium: 3.9 mmol/L (ref 3.5–5.2)
Sodium: 140 mmol/L (ref 134–144)
Total Protein: 7 g/dL (ref 6.0–8.5)
eGFR: 70 mL/min/{1.73_m2} (ref >59)

## 2022-02-17 LAB — HEMOGLOBIN A1C
Est. average glucose Bld gHb Est-mCnc: 120 mg/dL
Hgb A1c MFr Bld: 5.8 % — ABNORMAL HIGH (ref 4.8–5.6)

## 2022-02-17 LAB — TSH: TSH: 1.48 u[IU]/mL (ref 0.450–4.500)

## 2022-02-17 NOTE — Progress Notes (Signed)
[facility] PREP Weekly Session  Patient Details  Name: Jasmine Harrington MRN: [id number] Date of Birth: November 12, 1955 Age: 66 y.o. PCP: Dorothyann Peng, MD  Vitals:   02/16/22 1800  Weight: 244 lb 3.2 oz (110.8 kg)     [facility] Weekly seesion - 02/17/22 1500       [facility] "PREP" Location   [facility] "PREP" Location [name]      Weekly Session   Topic Discussed Calorie breakdown    Minutes exercised this week --   not reported   Classes attended to date 10             Bonnye Fava 02/17/2022, 3:58 PM

## 2022-02-24 NOTE — Progress Notes (Signed)
[facility] PREP Weekly Session  Patient Details  Name: Jasmine Harrington MRN: [id number] Date of Birth: 1956-01-04 Age: 66 y.o. PCP: Dorothyann Peng, MD  Vitals:   02/23/22 1800  Weight: 245 lb 3.2 oz (111.2 kg)     [facility] Weekly seesion - 02/24/22 1500       [facility] "PREP" Location   [facility] "PREP" Location [name]      Weekly Session   Topic Discussed Hitting roadblocks    Minutes exercised this week 75 minutes    Classes attended to date 12             Bonnye Fava 02/24/2022, 3:43 PM

## 2022-03-09 ENCOUNTER — Ambulatory Visit (INDEPENDENT_AMBULATORY_CARE_PROVIDER_SITE_OTHER): Payer: Medicare Other

## 2022-03-09 VITALS — BP 130/80 | HR 60 | Temp 98.4°F

## 2022-03-09 DIAGNOSIS — Z23 Encounter for immunization: Secondary | ICD-10-CM

## 2022-03-09 DIAGNOSIS — I10 Essential (primary) hypertension: Secondary | ICD-10-CM

## 2022-03-09 NOTE — Progress Notes (Signed)
Patient presents today for flu shot and BPC. She is currently taking HCTZ 12.5 and Olmesartan 40mg .   BP Readings from Last 3 Encounters:  03/09/22 130/80  02/16/22 136/82  02/03/22 (!) 144/77   Provider would like to continue with current medications and advises patient to increase exercise. Patient agrees.

## 2022-03-09 NOTE — Patient Instructions (Signed)

## 2022-04-01 NOTE — Progress Notes (Signed)
[facility] PREP Evaluation  Patient Details  Name: Jasmine Harrington MRN: [id number] Date of Birth: 21-Dec-1955 Age: 67 y.o. PCP: Glendale Chard, MD  Vitals:   03/23/22 1830  BP: 120/82  Pulse: 74  SpO2: 96%  Weight: 246 lb (111.6 kg)     [facility] Eval - 04/01/22 1600       [facility] "PREP" Location   [facility] "PREP" Location [name]      Referral    Referring Provider [employer] Start Date --   final date of PREP 03/23/22     Measurement   Waist Circumference 43 inches    Hip Circumference 51 inches    Body fat --   not measured     Information for Trainer   Goals reset      Mobility and Daily Activities   I find it easy to walk up or down two or more flights of stairs. 2    I have no trouble taking out the trash. 4    I do housework such as vacuuming and dusting on my own without difficulty. 4    I can easily lift a gallon of milk (8lbs). 4    I can easily walk a mile. 4    I have no trouble reaching into high cupboards or reaching down to pick up something from the floor. 4    I do not have trouble doing out-door work such as Armed forces logistics/support/administrative officer, raking leaves, or gardening. 1      Mobility and Daily Activities   I feel younger than my age. 4    I feel independent. 4    I feel energetic. 2    I live an active life.  2    I feel strong. 4    I feel healthy. 4    I feel active as other people my age. 2      How fit and strong are you.   Fit and Strong Total Score 45            Past Medical History:  Diagnosis Date   Arthritis    Hypertension    Pneumonia    Tuberculosis    pt was pos for tb . had to take 6 months of meds .    Past Surgical History:  Procedure Laterality Date   ABDOMINAL HYSTERECTOMY  2008   COLONOSCOPY  10/2017   KNEE SURGERY Right 1965   Social History   Tobacco Use  Smoking Status Never  Smokeless Tobacco Never   Attended 11 classes, 7 of 12 educational sessions Fit testing: Cardio march: 254 to 272 Sit to stand: 9 to  13 Bicep curl: 16 to 18 Balance improved but still needs aid of chair to balance. Encouraged to continue to exercise and work on balance  Barnett Hatter 04/01/2022, 4:27 PM

## 2022-04-07 ENCOUNTER — Other Ambulatory Visit: Payer: Self-pay

## 2022-04-07 MED ORDER — CETIRIZINE HCL 10 MG PO TABS: 10.0000 mg | ORAL_TABLET | Freq: Every day | ORAL | 2 refills | Status: DC

## 2022-04-07 MED ORDER — HYDROCHLOROTHIAZIDE 12.5 MG PO CAPS: 12.5000 mg | ORAL_CAPSULE | Freq: Every day | ORAL | 1 refills | Status: DC

## 2022-04-16 ENCOUNTER — Other Ambulatory Visit: Payer: Self-pay | Admitting: Internal Medicine

## 2022-04-16 DIAGNOSIS — Z1231 Encounter for screening mammogram for malignant neoplasm of breast: Secondary | ICD-10-CM

## 2022-05-31 ENCOUNTER — Ambulatory Visit
Admission: RE | Admit: 2022-05-31 | Discharge: 2022-05-31 | Disposition: A | Discharge status: Home / Self Care | Payer: TRICARE For Life (TFL) | Source: Ambulatory Visit | Attending: Internal Medicine | Admitting: Internal Medicine

## 2022-05-31 DIAGNOSIS — Z1231 Encounter for screening mammogram for malignant neoplasm of breast: Secondary | ICD-10-CM

## 2022-06-03 ENCOUNTER — Other Ambulatory Visit: Payer: Self-pay | Admitting: Internal Medicine

## 2022-08-31 ENCOUNTER — Encounter: Payer: Self-pay | Admitting: Internal Medicine

## 2022-08-31 ENCOUNTER — Other Ambulatory Visit: Payer: Self-pay

## 2022-08-31 ENCOUNTER — Ambulatory Visit (INDEPENDENT_AMBULATORY_CARE_PROVIDER_SITE_OTHER): Payer: Medicare Other | Admitting: Internal Medicine

## 2022-08-31 VITALS — BP 134/80 | HR 70 | Temp 98.5°F | Ht 60.4 in | Wt 254.6 lb

## 2022-08-31 DIAGNOSIS — I10 Essential (primary) hypertension: Secondary | ICD-10-CM

## 2022-08-31 DIAGNOSIS — Z6841 Body Mass Index (BMI) 40.0 and over, adult: Secondary | ICD-10-CM

## 2022-08-31 DIAGNOSIS — R7309 Other abnormal glucose: Secondary | ICD-10-CM

## 2022-08-31 DIAGNOSIS — Z0000 Encounter for general adult medical examination without abnormal findings: Secondary | ICD-10-CM

## 2022-08-31 DIAGNOSIS — E66813 Obesity, class 3: Secondary | ICD-10-CM

## 2022-08-31 DIAGNOSIS — E7800 Pure hypercholesterolemia, unspecified: Secondary | ICD-10-CM | POA: Diagnosis not present

## 2022-08-31 LAB — POCT URINALYSIS DIPSTICK
Bilirubin, UA: NEGATIVE
Glucose, UA: NEGATIVE
Ketones, UA: NEGATIVE
Leukocytes, UA: NEGATIVE
Nitrite, UA: NEGATIVE
Protein, UA: NEGATIVE
Spec Grav, UA: 1.02 (ref 1.010–1.025)
Urobilinogen, UA: 0.2 E.U./dL
pH, UA: 5 (ref 5.0–8.0)

## 2022-08-31 MED ORDER — ATORVASTATIN CALCIUM 10 MG PO TABS: ORAL_TABLET | ORAL | 3 refills | Status: DC

## 2022-08-31 MED ORDER — CETIRIZINE HCL 10 MG PO TABS: 10.0000 mg | ORAL_TABLET | Freq: Every day | ORAL | 2 refills | Status: DC

## 2022-08-31 MED ORDER — HYDROCHLOROTHIAZIDE 12.5 MG PO CAPS: 12.5000 mg | ORAL_CAPSULE | Freq: Every day | ORAL | 1 refills | Status: DC

## 2022-08-31 MED ORDER — OLMESARTAN MEDOXOMIL 40 MG PO TABS: 40.0000 mg | ORAL_TABLET | Freq: Every day | ORAL | 0 refills | Status: DC

## 2022-08-31 NOTE — Assessment & Plan Note (Signed)
She is encouraged to strive for BMI less than 30 to decrease cardiac risk. Advised to aim for at least 150 minutes of exercise per week.  

## 2022-08-31 NOTE — Progress Notes (Signed)
Subjective:    Jasmine Harrington is a 67 y.o. female who presents for a Welcome to Medicare exam.  She presents today for [facility] visit. She reports compliance with meds. She denies having any headaches, chest pain and shortness of breath. She states she is exercising twice weekly.  She has no specific concerns or complaints at this time.   Hypertension This is a chronic problem. The current episode started more than 1 year ago. The problem has been gradually improving since onset. The problem is controlled. Pertinent negatives include no blurred vision. Risk factors for coronary artery disease include dyslipidemia, obesity and sedentary lifestyle. Past treatments include angiotensin blockers and diuretics. The current treatment provides moderate improvement. Compliance problems include exercise.  Hypertensive end-organ damage includes kidney disease.     Patient Medicare AWV questionnaire was completed by the patient on 08/31/2022; I have confirmed that all information answered by patient is correct and no changes since this date.     Review of Systems  Constitutional: Negative.   HENT: Negative.    Eyes:  Negative for blurred vision.  Respiratory: Negative.    Cardiovascular: Negative.   Gastrointestinal: Negative.   Genitourinary: Negative.   Musculoskeletal: Negative.   Skin: Negative.   Neurological: Negative.   Endo/Heme/Allergies: Negative.   Psychiatric/Behavioral: Negative.       Objective:    Today's Vitals   08/31/22 0906  BP: 134/80  Pulse: 70  Temp: 98.5 F (36.9 C)  Weight: 254 lb 9.6 oz (115.5 kg)  Height: 5' 0.4" (1.534 m)  PainSc: 0-No pain  Body mass index is 49.07 kg/m. Wt Readings from Last 3 Encounters:  08/31/22 254 lb 9.6 oz (115.5 kg)  03/23/22 246 lb (111.6 kg)  02/23/22 245 lb 3.2 oz (111.2 kg)   BP Readings from Last 3 Encounters:  08/31/22 134/80  03/23/22 120/82  03/09/22 130/80    Medications Outpatient Encounter Medications as of 08/31/2022   Medication Sig   albuterol (VENTOLIN HFA) 108 (90 Base) MCG/ACT inhaler Inhale 1-2 puffs into the lungs every 6 (six) hours as needed for wheezing or shortness of breath.   azelastine (ASTELIN) 0.1 % nasal spray Place 1 spray into both nostrils 2 (two) times daily. Use in each nostril as directed   cholecalciferol (VITAMIN D) 1000 UNITS tablet Take 1,000 Units by mouth daily.    omega-3 acid ethyl esters (LOVAZA) 1 G capsule Take 1 g by mouth daily.   sodium chloride (OCEAN) 0.65 % SOLN nasal spray Place 1 spray into both nostrils as needed for congestion.   VITAMIN E PO Take by mouth.   [DISCONTINUED] atorvastatin (LIPITOR) 10 MG tablet TAKE 1 TABLET ON MONDAY THROUGH FRIDAY, SKIP WEEKENDS   [DISCONTINUED] cetirizine (ZYRTEC) 10 MG tablet Take 1 tablet (10 mg total) by mouth daily.   [DISCONTINUED] hydrochlorothiazide (MICROZIDE) 12.5 MG capsule Take 1 capsule (12.5 mg total) by mouth daily.   [DISCONTINUED] olmesartan (BENICAR) 40 MG tablet Take 1 tablet (40 mg total) by mouth daily.   fluticasone (FLONASE) 50 MCG/ACT nasal spray Place 1 spray into both nostrils in the morning and at bedtime for 7 days.   No facility-administered encounter medications on file as of 08/31/2022.     History: Past Medical History:  Diagnosis Date   Arthritis    Hypertension    Pneumonia    Tuberculosis    pt was pos for tb . had to take 6 months of meds .    Past Surgical History:  Procedure  Laterality Date   ABDOMINAL HYSTERECTOMY  2008   COLONOSCOPY  10/2017   KNEE SURGERY Right 1965    Family History  Problem Relation Age of Onset   Hypertension Paternal Grandfather    Hypertension Paternal Grandmother    Hypertension Maternal Grandmother    Hypertension Maternal Grandfather    Prostate cancer Father    Breast cancer Mother    Leukemia Brother    Social History   Occupational History   Not on file  Tobacco Use   Smoking status: Never   Smokeless tobacco: Never  Vaping Use   Vaping  status: Never Used  Substance and Sexual Activity   Alcohol use: No   Drug use: No   Sexual activity: Yes    Birth control/protection: Surgical    Comment: HYST     Tobacco Counseling Counseling given: Not Answered   Immunizations and Health Maintenance Immunization History  Administered Date(s) Administered   DTaP 11/27/2012   Fluad Quad(high Dose 65+) 03/09/2022   Influenza Inj Mdck Quad With Preservative 12/15/2017   Influenza,inj,Quad PF,6+ Mos 11/27/2018, 02/05/2020, 02/17/2021   Influenza-Unspecified 12/15/2017   Moderna Sars-Covid-2 Vaccination 06/13/2019, 07/17/2019, 02/27/2020   PNEUMOCOCCAL CONJUGATE-20 02/16/2022   Zoster Recombinant(Shingrix) 03/26/2019, 05/25/2019   Health Maintenance Due  Topic Date Due   COVID-19 Vaccine (4 - 2023-24 season) 10/30/2021    Activities of Daily Living    08/31/2022    9:42 AM 08/31/2022    9:17 AM  In your present state of health, do you have any difficulty performing the following activities:  Hearing?  0  Vision?  0  Difficulty concentrating or making decisions?  0  Walking or climbing stairs?  0  Dressing or bathing?  0  Doing errands, shopping?  0  Preparing Food and eating ? N   Using the Toilet? N   In the past six months, have you accidently leaked urine? N   Do you have problems with loss of bowel control? N   Managing your Medications? N   Managing your Finances? N   Housekeeping or managing your Housekeeping? N     Physical Exam  \ Physical Exam Vitals and nursing note reviewed.  Constitutional:      Appearance: Normal appearance. She is obese.  HENT:     Head: Normocephalic and atraumatic.     Right Ear: Tympanic membrane, ear canal and external ear normal.     Left Ear: Tympanic membrane, ear canal and external ear normal.  Eyes:     Extraocular Movements: Extraocular movements intact.     Conjunctiva/sclera: Conjunctivae normal.     Pupils: Pupils are equal, round, and reactive to light.   Cardiovascular:     Rate and Rhythm: Normal rate and regular rhythm.     Pulses: Normal pulses.     Heart sounds: Normal heart sounds.  Pulmonary:     Effort: Pulmonary effort is normal.     Breath sounds: Normal breath sounds.  Chest:  Breasts:    Tanner Score is 5.     Right: Normal.     Left: Normal.  Abdominal:     General: Bowel sounds are normal.     Palpations: Abdomen is soft.  Genitourinary:    Comments: Deferred  Musculoskeletal:        General: Normal range of motion.     Cervical back: Normal range of motion.  Skin:    General: Skin is warm and dry.  Neurological:     General: No  focal deficit present.     Mental Status: She is alert and oriented to person, place, and time.  Psychiatric:        Mood and Affect: Mood normal.        Behavior: Behavior normal.      Advanced Directives: Does Patient Have a Medical Advance Directive?: No Would patient like information on creating a medical advance directive?: Yes (MAU/Ambulatory/Procedural Areas - Information given)    Assessment:    This is a routine wellness examination for this patient .   Vision/Hearing screen Hearing Screening   500Hz  1000Hz  2000Hz  4000Hz   Right ear Fail Pass Pass Pass  Left ear Pass Pass Pass Pass   Vision Screening   Right eye Left eye Both eyes  Without correction 20/20 20/20 20/20   With correction       Dietary issues and exercise activities discussed:      Goals      continue a healthy life     She has no personal goals. She would like to maintain a healthy lifestyle.      Increase physical activity     Weight (lb) < 200 lb (90.7 kg)      Depression Screen    08/31/2022    9:12 AM 08/17/2021    9:47 AM 08/11/2020    9:38 AM 08/06/2019   11:30 AM  PHQ 2/9 Scores  PHQ - 2 Score 0 0 0 0  PHQ- 9 Score 0        Fall Risk    08/31/2022    9:12 AM  Fall Risk   Falls in the past year? 0  Number falls in past yr: 0  Injury with Fall? 0  Risk for fall due to : No  Fall Risks  Follow up Falls evaluation completed    MEDICARE RISK AT HOME:    TIMED UP AND GO: Was the test performed? Yes  Length of time to ambulate 10 feet: 10 sec Gait steady and fast without use of assistive device    Cognitive Function:        08/31/2022    9:17 AM  6CIT Screen  What Year? 0 points  What month? 0 points  What time? 0 points  Count back from 20 0 points  Months in reverse 0 points  Repeat phrase 2 points  Total Score 2 points    Patient Care Team: Dorothyann Peng, MD as PCP - General (Internal Medicine)  Diabetic Foot Exam:N/a  Community Resource Referral / Chronic Care Management: CRR required this visit?  No   CCM needed this visit?  No     Plan:   Encounter for Medicare annual wellness exam Assessment & Plan: The annual wellness visit was performed including discussion of advanced directives, assessment of functional status and cognitive function. EKG performed, NSR w/ nonspecific T abnormality. A full exam was also performed. She will rto in one year for AWV with [facility] Advisor.  PATIENT IS ADVISED TO GET 30-45 MINUTES REGULAR EXERCISE NO LESS THAN FOUR TO FIVE DAYS PER WEEK - BOTH WEIGHTBEARING EXERCISES AND AEROBIC ARE RECOMMENDED.  PATIENT IS ADVISED TO FOLLOW A HEALTHY DIET WITH AT LEAST SIX FRUITS/VEGGIES PER DAY, DECREASE INTAKE OF RED MEAT, AND TO INCREASE FISH INTAKE TO TWO DAYS PER WEEK.  MEATS/FISH SHOULD NOT BE FRIED, BAKED OR BROILED IS PREFERABLE.  IT IS ALSO IMPORTANT TO CUT BACK ON YOUR SUGAR INTAKE. PLEASE AVOID ANYTHING WITH ADDED SUGAR, CORN SYRUP OR OTHER SWEETENERS. IF YOU  MUST USE A SWEETENER, YOU CAN TRY STEVIA. IT IS ALSO IMPORTANT TO AVOID ARTIFICIALLY SWEETENERS AND DIET BEVERAGES. LASTLY, I SUGGEST WEARING SPF 50 SUNSCREEN ON EXPOSED PARTS AND ESPECIALLY WHEN IN THE DIRECT SUNLIGHT FOR AN EXTENDED PERIOD OF TIME.  PLEASE AVOID FAST FOOD RESTAURANTS AND INCREASE YOUR WATER INTAKE.     Orders: -     EKG 12-Lead  Essential  hypertension Assessment & Plan: Chronic, fair control. Goal BP<130/80.  She will c/w olmesartan 40mg  and hydrochlorothiazide 12.5mg  daily. Encouraged to follow low sodium diet. She will f/u in six months for re-evaluation.  Orders: -     POCT urinalysis dipstick -     Microalbumin / creatinine urine ratio -     EKG 12-Lead -     CBC -     CMP14+EGFR -     Lipid panel  Pure hypercholesterolemia Assessment & Plan: Chronic, she will c/w atorvastatin 10mg  daily. Encouraged to follow a heart healthy lifestyle with regular exercise, clean eating and adequate stress management.  Orders: -     CMP14+EGFR -     Lipid panel  Other abnormal glucose Assessment & Plan: Previous labs reviewed, her A1c has been elevated in the past. I will check an A1c today. Reminded to avoid refined sugars including sugary drinks/foods and processed meats including bacon, sausages and deli meats.    Orders: -     Hemoglobin A1c  Class 3 severe obesity due to excess calories with serious comorbidity and body mass index (BMI) of 45.0 to 49.9 in adult [facility]) Assessment & Plan: She is encouraged to strive for BMI less than 30 to decrease cardiac risk. Advised to aim for at least 150 minutes of exercise per week.       I have personally reviewed and noted the following in the patient's chart:   Medical and social history Use of alcohol, tobacco or illicit drugs  Current medications and supplements Functional ability and status Nutritional status Physical activity Advanced directives List of other physicians Hospitalizations, surgeries, and ER visits in previous 12 months Vitals Screenings to include cognitive, depression, and falls Referrals and appointments  In addition, I have reviewed and discussed with patient certain preventive protocols, quality metrics, and best practice recommendations. A written personalized care plan for preventive services as well as general preventive health recommendations  were provided to patient.     Gwynneth Aliment, MD 09/18/2022  After Visit Summary: given at checkout

## 2022-08-31 NOTE — Assessment & Plan Note (Addendum)
The annual wellness visit was performed including discussion of advanced directives, assessment of functional status and cognitive function. EKG performed, NSR w/ nonspecific T abnormality. A full exam was also performed. She will rto in one year for AWV with [facility] Advisor.  PATIENT IS ADVISED TO GET 30-45 MINUTES REGULAR EXERCISE NO LESS THAN FOUR TO FIVE DAYS PER WEEK - BOTH WEIGHTBEARING EXERCISES AND AEROBIC ARE RECOMMENDED.  PATIENT IS ADVISED TO FOLLOW A HEALTHY DIET WITH AT LEAST SIX FRUITS/VEGGIES PER DAY, DECREASE INTAKE OF RED MEAT, AND TO INCREASE FISH INTAKE TO TWO DAYS PER WEEK.  MEATS/FISH SHOULD NOT BE FRIED, BAKED OR BROILED IS PREFERABLE.  IT IS ALSO IMPORTANT TO CUT BACK ON YOUR SUGAR INTAKE. PLEASE AVOID ANYTHING WITH ADDED SUGAR, CORN SYRUP OR OTHER SWEETENERS. IF YOU MUST USE A SWEETENER, YOU CAN TRY STEVIA. IT IS ALSO IMPORTANT TO AVOID ARTIFICIALLY SWEETENERS AND DIET BEVERAGES. LASTLY, I SUGGEST WEARING SPF 50 SUNSCREEN ON EXPOSED PARTS AND ESPECIALLY WHEN IN THE DIRECT SUNLIGHT FOR AN EXTENDED PERIOD OF TIME.  PLEASE AVOID FAST FOOD RESTAURANTS AND INCREASE YOUR WATER INTAKE.

## 2022-08-31 NOTE — Patient Instructions (Addendum)
Jasmine Harrington , Thank you for taking time to come for your Medicare Wellness Visit. I appreciate your ongoing commitment to your health goals. Please review the following plan we discussed and let me know if I can assist you in the future.   These are the goals we discussed:  Goals      continue a healthy life     She has no personal goals. She would like to maintain a healthy lifestyle.      Increase physical activity     Weight (lb) < 200 lb (90.7 kg)        This is a list of the screening recommended for you and due dates:  Health Maintenance  Topic Date Due   COVID-19 Vaccine (4 - 2023-24 season) 10/30/2021   HIV Screening  08/31/2023*   Flu Shot  09/30/2022   DTaP/Tdap/Td vaccine (2 - Tdap) 11/28/2022   Medicare Annual Wellness Visit  08/31/2023   Mammogram  05/30/2024   Colon Cancer Screening  11/12/2027   Pneumonia Vaccine  Completed   DEXA scan (bone density measurement)  Completed   Hepatitis C Screening  Completed   Zoster (Shingles) Vaccine  Completed   HPV Vaccine  Aged Out   Pap Smear  Discontinued  *Topic was postponed. The date shown is not the original due date.     Health Maintenance, Female Adopting a healthy lifestyle and getting preventive care are important in promoting health and wellness. Ask your health care provider about: The right schedule for you to have regular tests and exams. Things you can do on your own to prevent diseases and keep yourself healthy. What should I know about diet, weight, and exercise? Eat a healthy diet  Eat a diet that includes plenty of vegetables, fruits, low-fat dairy products, and lean protein. Do not eat a lot of foods that are high in solid fats, added sugars, or sodium. Maintain a healthy weight Body mass index (BMI) is used to identify weight problems. It estimates body fat based on height and weight. Your health care provider can help determine your BMI and help you achieve or maintain a healthy weight. Get regular  exercise Get regular exercise. This is one of the most important things you can do for your health. Most adults should: Exercise for at least 150 minutes each week. The exercise should increase your heart rate and make you sweat (moderate-intensity exercise). Do strengthening exercises at least twice a week. This is in addition to the moderate-intensity exercise. Spend less time sitting. Even light physical activity can be beneficial. Watch cholesterol and blood lipids Have your blood tested for lipids and cholesterol at 66 years of age, then have this test every 5 years. Have your cholesterol levels checked more often if: Your lipid or cholesterol levels are high. You are older than 66 years of age. You are at high risk for heart disease. What should I know about cancer screening? Depending on your health history and family history, you may need to have cancer screening at various ages. This may include screening for: Breast cancer. Cervical cancer. Colorectal cancer. Skin cancer. Lung cancer. What should I know about heart disease, diabetes, and high blood pressure? Blood pressure and heart disease High blood pressure causes heart disease and increases the risk of stroke. This is more likely to develop in people who have high blood pressure readings or are overweight. Have your blood pressure checked: Every 3-5 years if you are 29-10 years of age. Every year  if you are 29 years old or older. Diabetes Have regular diabetes screenings. This checks your fasting blood sugar level. Have the screening done: Once every three years after age 52 if you are at a normal weight and have a low risk for diabetes. More often and at a younger age if you are overweight or have a high risk for diabetes. What should I know about preventing infection? Hepatitis B If you have a higher risk for hepatitis B, you should be screened for this virus. Talk with your health care provider to find out if you are at  risk for hepatitis B infection. Hepatitis C Testing is recommended for: Everyone born from [birth year] through 1964. Anyone with known risk factors for hepatitis C. Sexually transmitted infections (STIs) Get screened for STIs, including gonorrhea and chlamydia, if: You are sexually active and are younger than 66 years of age. You are older than 66 years of age and your health care provider tells you that you are at risk for this type of infection. Your sexual activity has changed since you were last screened, and you are at increased risk for chlamydia or gonorrhea. Ask your health care provider if you are at risk. Ask your health care provider about whether you are at high risk for HIV. Your health care provider may recommend a prescription medicine to help prevent HIV infection. If you choose to take medicine to prevent HIV, you should first get tested for HIV. You should then be tested every 3 months for as long as you are taking the medicine. Pregnancy If you are about to stop having your period (premenopausal) and you may become pregnant, seek counseling before you get pregnant. Take 400 to 800 micrograms (mcg) of folic acid every day if you become pregnant. Ask for birth control (contraception) if you want to prevent pregnancy. Osteoporosis and menopause Osteoporosis is a disease in which the bones lose minerals and strength with aging. This can result in bone fractures. If you are 46 years old or older, or if you are at risk for osteoporosis and fractures, ask your health care provider if you should: Be screened for bone loss. Take a calcium or vitamin D supplement to lower your risk of fractures. Be given hormone replacement therapy (HRT) to treat symptoms of menopause. Follow these instructions at home: Alcohol use Do not drink alcohol if: Your health care provider tells you not to drink. You are pregnant, may be pregnant, or are planning to become pregnant. If you drink alcohol: Limit  how much you have to: 0-1 drink a day. Know how much alcohol is in your drink. In the [place] one drink equals one 12 oz bottle of beer (355 mL), one 5 oz glass of wine (148 mL), or one 1 oz glass of hard liquor (44 mL). Lifestyle Do not use any products that contain nicotine or tobacco. These products include cigarettes, chewing tobacco, and vaping devices, such as e-cigarettes. If you need help quitting, ask your health care provider. Do not use street drugs. Do not share needles. Ask your health care provider for help if you need support or information about quitting drugs. General instructions Schedule regular health, dental, and eye exams. Stay current with your vaccines. Tell your health care provider if: You often feel depressed. You have ever been abused or do not feel safe at home. Summary Adopting a healthy lifestyle and getting preventive care are important in promoting health and wellness. Follow your health care provider's instructions about healthy  diet, exercising, and getting tested or screened for diseases. Follow your health care provider's instructions on monitoring your cholesterol and blood pressure. This information is not intended to replace advice given to you by your health care provider. Make sure you discuss any questions you have with your health care provider. Document Revised: 07/07/2020 Document Reviewed: 07/07/2020 Elsevier Patient Education  2024 [employer].

## 2022-08-31 NOTE — Assessment & Plan Note (Signed)
Chronic, she will c/w atorvastatin 10mg  daily. Encouraged to follow a heart healthy lifestyle with regular exercise, clean eating and adequate stress management.

## 2022-08-31 NOTE — Assessment & Plan Note (Signed)
Previous labs reviewed, her A1c has been elevated in the past. I will check an A1c today. Reminded to avoid refined sugars including sugary drinks/foods and processed meats including bacon, sausages and deli meats.

## 2022-09-01 LAB — CBC
Hematocrit: 41 % (ref 34.0–46.6)
Hemoglobin: 13.2 g/dL (ref 11.1–15.9)
MCH: 29.4 pg (ref 26.6–33.0)
MCHC: 32.2 g/dL (ref 31.5–35.7)
MCV: 91 fL (ref 79–97)
Platelets: 199 10*3/uL (ref 150–450)
RBC: 4.49 x10E6/uL (ref 3.77–5.28)
RDW: 12 % (ref 11.7–15.4)
WBC: 6.2 10*3/uL (ref 3.4–10.8)

## 2022-09-01 LAB — CMP14+EGFR
ALT: 18 IU/L (ref 0–32)
AST: 22 IU/L (ref 0–40)
Albumin: 4.2 g/dL (ref 3.9–4.9)
Alkaline Phosphatase: 87 IU/L (ref 44–121)
BUN/Creatinine Ratio: 14 (ref 12–28)
BUN: 15 mg/dL (ref 8–27)
Bilirubin Total: 0.4 mg/dL (ref 0.0–1.2)
CO2: 20 mmol/L (ref 20–29)
Calcium: 9.2 mg/dL (ref 8.7–10.3)
Chloride: 106 mmol/L (ref 96–106)
Creatinine, Ser: 1.06 mg/dL — ABNORMAL HIGH (ref 0.57–1.00)
Globulin, Total: 2.9 g/dL (ref 1.5–4.5)
Glucose: 96 mg/dL (ref 70–99)
Potassium: 3.9 mmol/L (ref 3.5–5.2)
Sodium: 142 mmol/L (ref 134–144)
Total Protein: 7.1 g/dL (ref 6.0–8.5)
eGFR: 58 mL/min/{1.73_m2} — ABNORMAL LOW (ref >59)

## 2022-09-01 LAB — LIPID PANEL
Chol/HDL Ratio: 3.3 ratio (ref 0.0–4.4)
Cholesterol, Total: 156 mg/dL (ref 100–199)
HDL: 47 mg/dL (ref >39)
LDL Chol Calc (NIH): 94 mg/dL (ref 0–99)
Triglycerides: 78 mg/dL (ref 0–149)
VLDL Cholesterol Cal: 15 mg/dL (ref 5–40)

## 2022-09-01 LAB — MICROALBUMIN / CREATININE URINE RATIO
Creatinine, Urine: 36.3 mg/dL
Microalb/Creat Ratio: <8 mg/g creat (ref 0–29)
Microalbumin, Urine: <3 ug/mL

## 2022-09-01 LAB — HEMOGLOBIN A1C
Est. average glucose Bld gHb Est-mCnc: 117 mg/dL
Hgb A1c MFr Bld: 5.7 % — ABNORMAL HIGH (ref 4.8–5.6)

## 2022-09-18 NOTE — Assessment & Plan Note (Addendum)
Chronic, fair control. Goal BP<130/80.  She will c/w olmesartan 40mg  and hydrochlorothiazide 12.5mg  daily. Encouraged to follow low sodium diet. She will f/u in six months for re-evaluation.

## 2022-10-04 ENCOUNTER — Other Ambulatory Visit: Payer: Self-pay

## 2022-10-04 MED ORDER — OLMESARTAN MEDOXOMIL 40 MG PO TABS: 40.0000 mg | ORAL_TABLET | Freq: Every day | ORAL | 2 refills | Status: DC

## 2022-12-06 DIAGNOSIS — Z961 Presence of intraocular lens: Secondary | ICD-10-CM | POA: Diagnosis not present

## 2022-12-06 DIAGNOSIS — H31092 Other chorioretinal scars, left eye: Secondary | ICD-10-CM | POA: Diagnosis not present

## 2022-12-06 DIAGNOSIS — H04123 Dry eye syndrome of bilateral lacrimal glands: Secondary | ICD-10-CM | POA: Diagnosis not present

## 2022-12-06 DIAGNOSIS — H26492 Other secondary cataract, left eye: Secondary | ICD-10-CM | POA: Diagnosis not present

## 2022-12-06 DIAGNOSIS — H40013 Open angle with borderline findings, low risk, bilateral: Secondary | ICD-10-CM | POA: Diagnosis not present

## 2023-03-03 ENCOUNTER — Ambulatory Visit (INDEPENDENT_AMBULATORY_CARE_PROVIDER_SITE_OTHER): Payer: Medicare Other | Admitting: Internal Medicine

## 2023-03-03 ENCOUNTER — Encounter: Payer: Self-pay | Admitting: Internal Medicine

## 2023-03-03 VITALS — BP 138/90 | HR 66 | Temp 98.0°F | Ht 60.0 in | Wt 263.4 lb

## 2023-03-03 DIAGNOSIS — R0982 Postnasal drip: Secondary | ICD-10-CM

## 2023-03-03 DIAGNOSIS — J309 Allergic rhinitis, unspecified: Secondary | ICD-10-CM | POA: Diagnosis not present

## 2023-03-03 DIAGNOSIS — E7800 Pure hypercholesterolemia, unspecified: Secondary | ICD-10-CM | POA: Diagnosis not present

## 2023-03-03 DIAGNOSIS — I10 Essential (primary) hypertension: Secondary | ICD-10-CM | POA: Diagnosis not present

## 2023-03-03 DIAGNOSIS — E66813 Obesity, class 3: Secondary | ICD-10-CM

## 2023-03-03 DIAGNOSIS — Z6841 Body Mass Index (BMI) 40.0 and over, adult: Secondary | ICD-10-CM

## 2023-03-03 DIAGNOSIS — R7309 Other abnormal glucose: Secondary | ICD-10-CM

## 2023-03-03 MED ORDER — AMLODIPINE BESYLATE 2.5 MG PO TABS: 2.5000 mg | ORAL_TABLET | Freq: Every day | ORAL | 11 refills | Status: DC

## 2023-03-03 MED ORDER — MONTELUKAST SODIUM 10 MG PO TABS: 10.0000 mg | ORAL_TABLET | Freq: Every day | ORAL | 2 refills | Status: DC

## 2023-03-03 MED ORDER — WEGOVY 0.5 MG/0.5ML ~~LOC~~ SOAJ: 0.5000 mg | SUBCUTANEOUS | 0 refills | Status: DC

## 2023-03-03 NOTE — Patient Instructions (Signed)
 Hypertension, Adult Hypertension is another name for high blood pressure. High blood pressure forces your heart to work harder to pump blood. This can cause problems over time. There are two numbers in a blood pressure reading. There is a top number (systolic) over a bottom number (diastolic). It is best to have a blood pressure that is below 120/80. What are the causes? The cause of this condition is not known. Some other conditions can lead to high blood pressure. What increases the risk? Some lifestyle factors can make you more likely to develop high blood pressure: Smoking. Not getting enough exercise or physical activity. Being overweight. Having too much fat, sugar, calories, or salt (sodium) in your diet. Drinking too much alcohol. Other risk factors include: Having any of these conditions: Heart disease. Diabetes. High cholesterol. Kidney disease. Obstructive sleep apnea. Having a family history of high blood pressure and high cholesterol. Age. The risk increases with age. Stress. What are the signs or symptoms? High blood pressure may not cause symptoms. Very high blood pressure (hypertensive crisis) may cause: Headache. Fast or uneven heartbeats (palpitations). Shortness of breath. Nosebleed. Vomiting or feeling like you may vomit (nauseous). Changes in how you see. Very bad chest pain. Feeling dizzy. Seizures. How is this treated? This condition is treated by making healthy lifestyle changes, such as: Eating healthy foods. Exercising more. Drinking less alcohol. Your doctor may prescribe medicine if lifestyle changes do not help enough and if: Your top number is above 130. Your bottom number is above 80. Your personal target blood pressure may vary. Follow these instructions at home: Eating and drinking  If told, follow the DASH eating plan. To follow this plan: Fill one half of your plate at each meal with fruits and vegetables. Fill one fourth of your plate  at each meal with whole grains. Whole grains include whole-wheat pasta, brown rice, and whole-grain bread. Eat or drink low-fat dairy products, such as skim milk or low-fat yogurt. Fill one fourth of your plate at each meal with low-fat (lean) proteins. Low-fat proteins include fish, chicken without skin, eggs, beans, and tofu. Avoid fatty meat, cured and processed meat, or chicken with skin. Avoid pre-made or processed food. Limit the amount of salt in your diet to less than 1,500 mg each day. Do not drink alcohol if: Your doctor tells you not to drink. You are pregnant, may be pregnant, or are planning to become pregnant. If you drink alcohol: Limit how much you have to: 0-1 drink a day for women. 0-2 drinks a day for men. Know how much alcohol is in your drink. In the [place] one drink equals one 12 oz bottle of beer (355 mL), one 5 oz glass of wine (148 mL), or one 1 oz glass of hard liquor (44 mL). Lifestyle  Work with your doctor to stay at a healthy weight or to lose weight. Ask your doctor what the best weight is for you. Get at least 30 minutes of exercise that causes your heart to beat faster (aerobic exercise) most days of the week. This may include walking, swimming, or biking. Get at least 30 minutes of exercise that strengthens your muscles (resistance exercise) at least 3 days a week. This may include lifting weights or doing Pilates. Do not smoke or use any products that contain nicotine or tobacco. If you need help quitting, ask your doctor. Check your blood pressure at home as told by your doctor. Keep all follow-up visits. Medicines Take over-the-counter and prescription medicines  only as told by your doctor. Follow directions carefully. Do not skip doses of blood pressure medicine. The medicine does not work as well if you skip doses. Skipping doses also puts you at risk for problems. Ask your doctor about side effects or reactions to medicines that you should watch  for. Contact a doctor if: You think you are having a reaction to the medicine you are taking. You have headaches that keep coming back. You feel dizzy. You have swelling in your ankles. You have trouble with your vision. Get help right away if: You get a very bad headache. You start to feel mixed up (confused). You feel weak or numb. You feel faint. You have very bad pain in your: Chest. Belly (abdomen). You vomit more than once. You have trouble breathing. These symptoms may be an emergency. Get help right away. Call 911. Do not wait to see if the symptoms will go away. Do not drive yourself to the hospital. Summary Hypertension is another name for high blood pressure. High blood pressure forces your heart to work harder to pump blood. For most people, a normal blood pressure is less than 120/80. Making healthy choices can help lower blood pressure. If your blood pressure does not get lower with healthy choices, you may need to take medicine. This information is not intended to replace advice given to you by your health care provider. Make sure you discuss any questions you have with your health care provider. Document Revised: 12/04/2020 Document Reviewed: 12/04/2020 Elsevier Patient Education  2024 [employer].

## 2023-03-03 NOTE — Progress Notes (Signed)
 I,[name], CMA,acting as a neurosurgeon for [name], MD.,have documented all relevant documentation on the behalf of [name], MD,as directed by  [name], MD while in the presence of [name], MD.  Subjective:  Patient ID: Jasmine Harrington , female    DOB: [birth year] , 68 y.o.   MRN: [id number]  Chief Complaint  Patient presents with   Hypertension   Hyperlipidemia    HPI  She presents today for BP check. She reports compliance with meds.  She denies headaches, chest pain and shortness of breath.   She reports lots of sinus drainage which drains down into her throat at night. Which makes her cough up yellow phlegm. Denies fever.  She takes Zyrtec  everyday. She states this has not helped much.   Hypertension This is a chronic problem. The current episode started more than 1 year ago. The problem has been gradually improving since onset. The problem is controlled. Pertinent negatives include no blurred vision. Risk factors for coronary artery disease include dyslipidemia, obesity and sedentary lifestyle. Past treatments include angiotensin blockers and diuretics. The current treatment provides moderate improvement. Compliance problems include exercise.  Hypertensive end-organ damage includes kidney disease.     Past Medical History:  Diagnosis Date   Arthritis    Hypertension    Pneumonia    Tuberculosis    pt was pos for tb . had to take 6 months of meds .      Family History  Problem Relation Age of Onset   Hypertension Paternal Grandfather    Hypertension Paternal Grandmother    Hypertension Maternal Grandmother    Hypertension Maternal Grandfather    Prostate cancer Father    Breast cancer Mother    Leukemia Brother      Current Outpatient Medications:    albuterol  (VENTOLIN  HFA) 108 (90 Base) MCG/ACT inhaler, Inhale 1-2 puffs into the lungs every 6 (six) hours as needed for wheezing or shortness of breath., Disp: 8 g, Rfl: 0   amLODipine   (NORVASC ) 2.5 MG tablet, Take 1 tablet (2.5 mg total) by mouth daily., Disp: 30 tablet, Rfl: 11   atorvastatin  (LIPITOR) 10 MG tablet, TAKE 1 TABLET ON MONDAY THROUGH FRIDAY, SKIP WEEKENDS, Disp: 90 tablet, Rfl: 3   azelastine  (ASTELIN ) 0.1 % nasal spray, Place 1 spray into both nostrils 2 (two) times daily. Use in each nostril as directed, Disp: 90 mL, Rfl: 3   cetirizine  (ZYRTEC ) 10 MG tablet, Take 1 tablet (10 mg total) by mouth daily., Disp: 90 tablet, Rfl: 2   cholecalciferol (VITAMIN D ) 1000 UNITS tablet, Take 1,000 Units by mouth daily. , Disp: , Rfl:    hydrochlorothiazide  (MICROZIDE ) 12.5 MG capsule, Take 1 capsule (12.5 mg total) by mouth daily., Disp: 90 capsule, Rfl: 1   montelukast  (SINGULAIR ) 10 MG tablet, Take 1 tablet (10 mg total) by mouth daily., Disp: 30 tablet, Rfl: 2   olmesartan  (BENICAR ) 40 MG tablet, Take 1 tablet (40 mg total) by mouth daily., Disp: 90 tablet, Rfl: 2   omega-3 acid ethyl esters (LOVAZA) 1 G capsule, Take 1 g by mouth daily., Disp: , Rfl:    Semaglutide -Weight Management (WEGOVY ) 0.5 MG/0.5ML SOAJ, Inject 0.5 mg into the skin once a week., Disp: 2 mL, Rfl: 0   sodium chloride (OCEAN) 0.65 % SOLN nasal spray, Place 1 spray into both nostrils as needed for congestion., Disp: 60 mL, Rfl: 0   VITAMIN E PO, Take by mouth., Disp: , Rfl:  fluticasone  (FLONASE ) 50 MCG/ACT nasal spray, Place 1 spray into both nostrils in the morning and at bedtime for 7 days., Disp: 1 g, Rfl: 0   Allergies  Allergen Reactions   Aspirin     rash   Penicillins Rash     Review of Systems  Constitutional: Negative.   HENT:  Positive for postnasal drip.   Eyes:  Negative for blurred vision.  Respiratory: Negative.    Cardiovascular: Negative.   Gastrointestinal: Negative.   Neurological: Negative.   Psychiatric/Behavioral:  The patient is nervous/anxious.        She states she had an episode before Thanksgiving.  She picked up a turkey sausage biscuit from [employer],  driving to work when she all of a sudden felt like things were closing in on her. She thought her sugar was low, so she started to eat her biscuit. She was going to get back on the road, when it hit her. She acutely became sob with palpitations. Denies cp/diaphoresis. Her daughter came and picked her up, she went home and went to sleep. She has no prior episodes of this. She has not had any recurrent symptoms. She states her kids stated she had a panic attack. She admits she had little sleep because she was preparing things for Thanksgiving.      Today's Vitals   03/03/23 0850 03/03/23 0908  BP: (!) 140/90 (!) 138/90  Pulse: 66   Temp: 98 F (36.7 C)   SpO2: 98%   Weight: 263 lb 6.4 oz (119.5 kg)   Height: 5' (1.524 m)    Body mass index is 51.44 kg/m.  Wt Readings from Last 3 Encounters:  03/03/23 263 lb 6.4 oz (119.5 kg)  08/31/22 254 lb 9.6 oz (115.5 kg)  03/23/22 246 lb (111.6 kg)    BP Readings from Last 3 Encounters:  03/03/23 (!) 138/90  08/31/22 134/80  03/23/22 120/82    Objective:  Physical Exam Vitals and nursing note reviewed.  Constitutional:      Appearance: Normal appearance. She is obese.  HENT:     Head: Normocephalic and atraumatic.  Eyes:     Extraocular Movements: Extraocular movements intact.  Cardiovascular:     Rate and Rhythm: Normal rate and regular rhythm.     Heart sounds: Normal heart sounds.  Pulmonary:     Effort: Pulmonary effort is normal.     Breath sounds: Normal breath sounds.  Musculoskeletal:     Cervical back: Normal range of motion.  Skin:    General: Skin is warm.  Neurological:     General: No focal deficit present.     Mental Status: She is alert.  Psychiatric:        Mood and Affect: Mood normal.        Behavior: Behavior normal.         Assessment And Plan:  Essential hypertension Assessment & Plan: Chronic, uncontrolled. I will add amlodipine  2.5mg  nightly to her current regimen. She will continue with olmesartan   40mg  and hydrochlorothiazide  12.5mg  daily.   Orders: -     CMP14+EGFR -     Lipid panel  Pure hypercholesterolemia Assessment & Plan: Chronic, she will c/w atorvastatin  10mg  daily. Encouraged to follow a heart healthy lifestyle with regular exercise, clean eating and adequate stress management.  Orders: -     CMP14+EGFR -     Lipid panel -     TSH  Allergic rhinitis with postnasal drip Assessment & Plan: She will continue with  Zyrtec  daily. I will add montelukast  10mg  nightly to her current regimen. She has been this in the past.    Other abnormal glucose Assessment & Plan: Previous labs reviewed, her A1c has been elevated in the past. I will check an A1c today. Reminded to avoid refined sugars including sugary drinks/foods and processed meats including bacon, sausages and deli meats.    Orders: -     Hemoglobin A1c  Class 3 severe obesity due to excess calories with serious comorbidity and body mass index (BMI) of 50.0 to 59.9 in adult [facility]) Assessment & Plan: BMI 51. She is advised of 9lb weight gain. She has completed PREP program in the past. She is encouraged to increase her daily activity and to consider participation in [employer] program.    Other orders -     Montelukast  Sodium; Take 1 tablet (10 mg total) by mouth daily.  Dispense: 30 tablet; Refill: 2 -     amLODIPine  Besylate; Take 1 tablet (2.5 mg total) by mouth daily.  Dispense: 30 tablet; Refill: 11 -     Wegovy ; Inject 0.5 mg into the skin once a week.  Dispense: 2 mL; Refill: 0  She is encouraged to strive for BMI less than 30 to decrease cardiac risk. Advised to aim for at least 150 minutes of exercise per week.    Return in 19 days (on 03/22/2023), or [place] - bp check.  Patient was given opportunity to ask questions. Patient verbalized understanding of the plan and was able to repeat key elements of the plan. All questions were answered to their satisfaction.    I, [name], MD, have reviewed  all documentation for this visit. The documentation on 03/03/23 for the exam, diagnosis, procedures, and orders are all accurate and complete.   IF YOU HAVE BEEN REFERRED TO A SPECIALIST, IT MAY TAKE 1-2 WEEKS TO SCHEDULE/PROCESS THE REFERRAL. IF YOU HAVE NOT HEARD FROM US /SPECIALIST IN TWO WEEKS, PLEASE GIVE US  A CALL AT [phone].   THE PATIENT IS ENCOURAGED TO PRACTICE SOCIAL DISTANCING DUE TO THE COVID-19 PANDEMIC.

## 2023-03-03 NOTE — Assessment & Plan Note (Signed)
Chronic, she will c/w atorvastatin 10mg  daily. Encouraged to follow a heart healthy lifestyle with regular exercise, clean eating and adequate stress management.

## 2023-03-03 NOTE — Assessment & Plan Note (Signed)
 Previous labs reviewed, her A1c has been elevated in the past. I will check an A1c today. Reminded to avoid refined sugars including sugary drinks/foods and processed meats including bacon, sausages and deli meats.

## 2023-03-03 NOTE — Assessment & Plan Note (Signed)
 She will continue with Zyrtec daily. I will add montelukast 10mg  nightly to her current regimen. She has been this in the past.

## 2023-03-03 NOTE — Assessment & Plan Note (Signed)
 Chronic, uncontrolled. I will add amlodipine 2.5mg  nightly to her current regimen. She will continue with olmesartan 40mg  and hydrochlorothiazide 12.5mg  daily.

## 2023-03-03 NOTE — Assessment & Plan Note (Addendum)
 BMI 51. She is advised of 9lb weight gain. She has completed PREP program in the past. She is encouraged to increase her daily activity and to consider participation in [employer].

## 2023-03-04 LAB — LIPID PANEL
Chol/HDL Ratio: 3 {ratio} (ref 0.0–4.4)
Cholesterol, Total: 134 mg/dL (ref 100–199)
HDL: 44 mg/dL (ref >39)
LDL Chol Calc (NIH): 75 mg/dL (ref 0–99)
Triglycerides: 74 mg/dL (ref 0–149)
VLDL Cholesterol Cal: 15 mg/dL (ref 5–40)

## 2023-03-04 LAB — HEMOGLOBIN A1C
Est. average glucose Bld gHb Est-mCnc: 117 mg/dL
Hgb A1c MFr Bld: 5.7 % — ABNORMAL HIGH (ref 4.8–5.6)

## 2023-03-04 LAB — CMP14+EGFR
ALT: 20 [IU]/L (ref 0–32)
AST: 23 [IU]/L (ref 0–40)
Albumin: 4 g/dL (ref 3.9–4.9)
Alkaline Phosphatase: 100 [IU]/L (ref 44–121)
BUN/Creatinine Ratio: 18 (ref 12–28)
BUN: 17 mg/dL (ref 8–27)
Bilirubin Total: 0.3 mg/dL (ref 0.0–1.2)
CO2: 23 mmol/L (ref 20–29)
Calcium: 9.1 mg/dL (ref 8.7–10.3)
Chloride: 103 mmol/L (ref 96–106)
Creatinine, Ser: 0.93 mg/dL (ref 0.57–1.00)
Globulin, Total: 3 g/dL (ref 1.5–4.5)
Glucose: 106 mg/dL — ABNORMAL HIGH (ref 70–99)
Potassium: 3.9 mmol/L (ref 3.5–5.2)
Sodium: 140 mmol/L (ref 134–144)
Total Protein: 7 g/dL (ref 6.0–8.5)
eGFR: 68 mL/min/{1.73_m2} (ref >59)

## 2023-03-04 LAB — TSH: TSH: 2.39 u[IU]/mL (ref 0.450–4.500)

## 2023-03-16 ENCOUNTER — Other Ambulatory Visit: Payer: Self-pay

## 2023-03-16 MED ORDER — OLMESARTAN MEDOXOMIL 40 MG PO TABS: 40.0000 mg | ORAL_TABLET | Freq: Every day | ORAL | 2 refills | Status: DC

## 2023-03-16 MED ORDER — ATORVASTATIN CALCIUM 10 MG PO TABS: ORAL_TABLET | ORAL | 3 refills | Status: DC

## 2023-03-16 MED ORDER — CETIRIZINE HCL 10 MG PO TABS: 10.0000 mg | ORAL_TABLET | Freq: Every day | ORAL | 2 refills | Status: DC

## 2023-03-16 MED ORDER — HYDROCHLOROTHIAZIDE 12.5 MG PO CAPS: 12.5000 mg | ORAL_CAPSULE | Freq: Every day | ORAL | 1 refills | Status: DC

## 2023-03-22 ENCOUNTER — Ambulatory Visit: Payer: Medicare Other

## 2023-03-22 VITALS — BP 128/84 | HR 75 | Temp 98.1°F | Ht 60.0 in | Wt 263.0 lb

## 2023-03-22 DIAGNOSIS — I10 Essential (primary) hypertension: Secondary | ICD-10-CM

## 2023-03-22 NOTE — Progress Notes (Signed)
Patient presents today for bpc. She currently takes Olmesartan 40MG  in the morning , Amlodipine 2.5 she takes at night & hydrochlorothiazide she takes in the morning. Denies headache, chest pain & sob. She drinks 6-8 bottles of water a day.  BP Readings from Last 3 Encounters:  03/22/23 128/84  03/03/23 (!) 138/90  08/31/22 134/80  She is to continue with current medications per provider. Patient aware BP goal <120/80.

## 2023-03-22 NOTE — Patient Instructions (Signed)
Hypertension, Adult Hypertension is another name for high blood pressure. High blood pressure forces your heart to work harder to pump blood. This can cause problems over time. There are two numbers in a blood pressure reading. There is a top number (systolic) over a bottom number (diastolic). It is best to have a blood pressure that is below 120/80. What are the causes? The cause of this condition is not known. Some other conditions can lead to high blood pressure. What increases the risk? Some lifestyle factors can make you more likely to develop high blood pressure: Smoking. Not getting enough exercise or physical activity. Being overweight. Having too much fat, sugar, calories, or salt (sodium) in your diet. Drinking too much alcohol. Other risk factors include: Having any of these conditions: Heart disease. Diabetes. High cholesterol. Kidney disease. Obstructive sleep apnea. Having a family history of high blood pressure and high cholesterol. Age. The risk increases with age. Stress. What are the signs or symptoms? High blood pressure may not cause symptoms. Very high blood pressure (hypertensive crisis) may cause: Headache. Fast or uneven heartbeats (palpitations). Shortness of breath. Nosebleed. Vomiting or feeling like you may vomit (nauseous). Changes in how you see. Very bad chest pain. Feeling dizzy. Seizures. How is this treated? This condition is treated by making healthy lifestyle changes, such as: Eating healthy foods. Exercising more. Drinking less alcohol. Your doctor may prescribe medicine if lifestyle changes do not help enough and if: Your top number is above 130. Your bottom number is above 80. Your personal target blood pressure may vary. Follow these instructions at home: Eating and drinking  If told, follow the DASH eating plan. To follow this plan: Fill one half of your plate at each meal with fruits and vegetables. Fill one fourth of your plate  at each meal with whole grains. Whole grains include whole-wheat pasta, brown rice, and whole-grain bread. Eat or drink low-fat dairy products, such as skim milk or low-fat yogurt. Fill one fourth of your plate at each meal with low-fat (lean) proteins. Low-fat proteins include fish, chicken without skin, eggs, beans, and tofu. Avoid fatty meat, cured and processed meat, or chicken with skin. Avoid pre-made or processed food. Limit the amount of salt in your diet to less than 1,500 mg each day. Do not drink alcohol if: Your doctor tells you not to drink. You are pregnant, may be pregnant, or are planning to become pregnant. If you drink alcohol: Limit how much you have to: 0-1 drink a day for women. 0-2 drinks a day for men. Know how much alcohol is in your drink. In the [place] one drink equals one 12 oz bottle of beer (355 mL), one 5 oz glass of wine (148 mL), or one 1 oz glass of hard liquor (44 mL). Lifestyle  Work with your doctor to stay at a healthy weight or to lose weight. Ask your doctor what the best weight is for you. Get at least 30 minutes of exercise that causes your heart to beat faster (aerobic exercise) most days of the week. This may include walking, swimming, or biking. Get at least 30 minutes of exercise that strengthens your muscles (resistance exercise) at least 3 days a week. This may include lifting weights or doing Pilates. Do not smoke or use any products that contain nicotine or tobacco. If you need help quitting, ask your doctor. Check your blood pressure at home as told by your doctor. Keep all follow-up visits. Medicines Take over-the-counter and prescription medicines   only as told by your doctor. Follow directions carefully. Do not skip doses of blood pressure medicine. The medicine does not work as well if you skip doses. Skipping doses also puts you at risk for problems. Ask your doctor about side effects or reactions to medicines that you should watch  for. Contact a doctor if: You think you are having a reaction to the medicine you are taking. You have headaches that keep coming back. You feel dizzy. You have swelling in your ankles. You have trouble with your vision. Get help right away if: You get a very bad headache. You start to feel mixed up (confused). You feel weak or numb. You feel faint. You have very bad pain in your: Chest. Belly (abdomen). You vomit more than once. You have trouble breathing. These symptoms may be an emergency. Get help right away. Call 911. Do not wait to see if the symptoms will go away. Do not drive yourself to the hospital. Summary Hypertension is another name for high blood pressure. High blood pressure forces your heart to work harder to pump blood. For most people, a normal blood pressure is less than 120/80. Making healthy choices can help lower blood pressure. If your blood pressure does not get lower with healthy choices, you may need to take medicine. This information is not intended to replace advice given to you by your health care provider. Make sure you discuss any questions you have with your health care provider. Document Revised: 12/04/2020 Document Reviewed: 12/04/2020 Elsevier Patient Education  2024 [employer]  

## 2023-04-18 ENCOUNTER — Encounter (HOSPITAL_COMMUNITY): Payer: Self-pay

## 2023-04-18 ENCOUNTER — Ambulatory Visit (HOSPITAL_COMMUNITY)
Admission: RE | Admit: 2023-04-18 | Discharge: 2023-04-18 | Disposition: A | Discharge status: Home / Self Care | Payer: TRICARE For Life (TFL) | Source: Ambulatory Visit | Attending: Internal Medicine | Admitting: Internal Medicine

## 2023-04-18 VITALS — BP 133/84 | HR 65 | Temp 98.3°F | Resp 16

## 2023-04-18 DIAGNOSIS — J0100 Acute maxillary sinusitis, unspecified: Secondary | ICD-10-CM

## 2023-04-18 MED ORDER — AZITHROMYCIN 250 MG PO TABS: 250.0000 mg | ORAL_TABLET | Freq: Every day | ORAL | 0 refills | Status: DC

## 2023-04-18 MED ORDER — PREDNISONE 20 MG PO TABS
40.0000 mg | ORAL_TABLET | Freq: Every day | ORAL | 0 refills | Status: AC
Start: 2023-04-18 — End: 2023-04-23

## 2023-04-18 MED ORDER — PROMETHAZINE-DM 6.25-15 MG/5ML PO SYRP: 5.0000 mL | ORAL_SOLUTION | Freq: Three times a day (TID) | ORAL | 0 refills | Status: AC | PRN

## 2023-04-18 NOTE — ED Provider Notes (Signed)
 [facility]    CSN: [id number] Arrival date & time: 04/18/23  1541      History   Chief Complaint No chief complaint on file.   HPI Jasmine Harrington is a 68 y.o. female.   68 year old female presents urgent care with complaints of sinus congestion that has been going on for 2 days.  She has been using Flonase and saline nose spray but this has not been helping.  She has been coughing especially at night due to the congestion running down the back of her throat.  She has been using Tylenol so she is unsure if she has been running a fever.  She denies shortness of breath, chest pain, headache.     Past Medical History:  Diagnosis Date   Arthritis    Hypertension    Pneumonia    Tuberculosis    pt was pos for tb . had to take 6 months of meds .     Patient Active Problem List   Diagnosis Date Noted   Allergic rhinitis with postnasal drip 03/03/2023   Other abnormal glucose 08/31/2022   Pure hypercholesterolemia 08/31/2022   Estrogen deficiency 08/11/2020   Class 3 severe obesity due to excess calories with serious comorbidity and body mass index (BMI) of 50.0 to 59.9 in adult [facility]) 03/26/2019   Encounter for Medicare annual wellness exam 06/22/2017   Paresthesia 07/07/2016   Essential hypertension 12/14/2011    Past Surgical History:  Procedure Laterality Date   ABDOMINAL HYSTERECTOMY  2008   COLONOSCOPY  10/2017   KNEE SURGERY Right 1965    OB History     Gravida  2   Para  1   Term  1   Preterm      AB  1   Living  1      SAB  1   IAB      Ectopic      Multiple      Live Births               Home Medications    Prior to Admission medications   Medication Sig Start Date End Date Taking? Authorizing Provider  albuterol (VENTOLIN HFA) 108 (90 Base) MCG/ACT inhaler Inhale 1-2 puffs into the lungs every 6 (six) hours as needed for wheezing or shortness of breath. 12/19/20   Tomi Bamberger, PA-C  amLODipine (NORVASC) 2.5 MG  tablet Take 1 tablet (2.5 mg total) by mouth daily. 03/03/23 03/02/24  Dorothyann Peng, MD  atorvastatin (LIPITOR) 10 MG tablet TAKE 1 TABLET ON MONDAY THROUGH FRIDAY, SKIP WEEKENDS 03/16/23   Dorothyann Peng, MD  azelastine (ASTELIN) 0.1 % nasal spray Place 1 spray into both nostrils 2 (two) times daily. Use in each nostril as directed 12/19/17   Dorothyann Peng, MD  cetirizine (ZYRTEC) 10 MG tablet Take 1 tablet (10 mg total) by mouth daily. 03/16/23   Dorothyann Peng, MD  cholecalciferol (VITAMIN D) 1000 UNITS tablet Take 1,000 Units by mouth daily.     [provider]  fluticasone (FLONASE) 50 MCG/ACT nasal spray Place 1 spray into both nostrils in the morning and at bedtime for 7 days. 08/11/20 02/16/22  Dorothyann Peng, MD  hydrochlorothiazide (MICROZIDE) 12.5 MG capsule Take 1 capsule (12.5 mg total) by mouth daily. 03/16/23   Dorothyann Peng, MD  montelukast (SINGULAIR) 10 MG tablet Take 1 tablet (10 mg total) by mouth daily. 03/03/23 03/02/24  Dorothyann Peng, MD  olmesartan (BENICAR) 40 MG tablet Take 1 tablet (  40 mg total) by mouth daily. 03/16/23   Dorothyann Peng, MD  omega-3 acid ethyl esters (LOVAZA) 1 G capsule Take 1 g by mouth daily.    [provider]  Semaglutide-Weight Management (WEGOVY) 0.5 MG/0.5ML SOAJ Inject 0.5 mg into the skin once a week. 03/03/23   Dorothyann Peng, MD  sodium chloride (OCEAN) 0.65 % SOLN nasal spray Place 1 spray into both nostrils as needed for congestion. 10/26/19   Darr, Gerilyn Pilgrim, PA-C  VITAMIN E PO Take by mouth.    [provider]    Family History Family History  Problem Relation Age of Onset   Hypertension Paternal Grandfather    Hypertension Paternal Grandmother    Hypertension Maternal Grandmother    Hypertension Maternal Grandfather    Prostate cancer Father    Breast cancer Mother    Leukemia Brother     Social History Social History   Tobacco Use   Smoking status: Never   Smokeless tobacco: Never  Vaping Use   Vaping status:  Never Used  Substance Use Topics   Alcohol use: No   Drug use: No     Allergies   Aspirin and Penicillins   Review of Systems Review of Systems  Constitutional:  Negative for chills and fever.  HENT:  Positive for congestion. Negative for ear pain and sore throat.   Eyes:  Negative for pain and visual disturbance.  Respiratory:  Positive for cough. Negative for shortness of breath.   Cardiovascular:  Negative for chest pain and palpitations.  Gastrointestinal:  Negative for abdominal pain and vomiting.  Genitourinary:  Negative for dysuria and hematuria.  Musculoskeletal:  Negative for arthralgias and back pain.  Skin:  Negative for color change and rash.  Neurological:  Negative for seizures and syncope.  All other systems reviewed and are negative.    Physical Exam Triage Vital Signs ED Triage Vitals  Encounter Vitals Group     BP 04/18/23 1615 133/84     Systolic BP Percentile --      Diastolic BP Percentile --      Pulse Rate 04/18/23 1615 65     Resp 04/18/23 1615 16     Temp 04/18/23 1615 98.3 F (36.8 C)     Temp Source 04/18/23 1615 Oral     SpO2 04/18/23 1615 97 %     Weight --      Height --      Head Circumference --      Peak Flow --      Pain Score 04/18/23 1617 0     Pain Loc --      Pain Education --      Exclude from Growth Chart --    No data found.  Updated Vital Signs BP 133/84 (BP Location: Right Arm)   Pulse 65   Temp 98.3 F (36.8 C) (Oral)   Resp 16   SpO2 97%   Visual Acuity Right Eye Distance:   Left Eye Distance:   Bilateral Distance:    Right Eye Near:   Left Eye Near:    Bilateral Near:     Physical Exam Vitals and nursing note reviewed.  Constitutional:      General: She is not in acute distress.    Appearance: She is well-developed.  HENT:     Head: Normocephalic and atraumatic.     Right Ear: Tympanic membrane normal.     Left Ear: Tympanic membrane normal.     Nose: Congestion and rhinorrhea  present.      Mouth/Throat:     Pharynx: Posterior oropharyngeal erythema (mild) present.  Eyes:     Conjunctiva/sclera: Conjunctivae normal.  Cardiovascular:     Rate and Rhythm: Normal rate and regular rhythm.     Heart sounds: No murmur heard. Pulmonary:     Effort: Pulmonary effort is normal. No respiratory distress.     Breath sounds: Normal breath sounds.  Abdominal:     Palpations: Abdomen is soft.     Tenderness: There is no abdominal tenderness.  Musculoskeletal:        General: No swelling.     Cervical back: Neck supple.  Skin:    General: Skin is warm and dry.     Capillary Refill: Capillary refill takes less than 2 seconds.  Neurological:     Mental Status: She is alert.  Psychiatric:        Mood and Affect: Mood normal.      UC Treatments / Results  Labs (all labs ordered are listed, but only abnormal results are displayed) Labs Reviewed - No data to display  EKG   Radiology No results found.  Procedures Procedures (including critical care time)  Medications Ordered in UC Medications - No data to display  Initial Impression / Assessment and Plan / UC Course  I have reviewed the triage vital signs and the nursing notes.  Pertinent labs & imaging results that were available during my care of the patient were reviewed by me and considered in my medical decision making (see chart for details).     Acute non-recurrent maxillary sinusitis   Symptoms are most consistent with a sinus infection. Vital signs are good and lungs are clear. We can treat this with the following:  Azithromycin 250mg  Take 2 tablets today and the 1 tablet daily for 4 more days. This is an antibiotic. Take this with food.  Prednisone 40 mg (2 tablets) once daily for 5 days. Take this in the morning.  This is a steroid to help with inflammation and pain.  Promethazine DM 5 mL every 8 hours as needed for cough.  Use caution as this medication can cause drowsiness. Continue to use flonase daily  for nasal congestion.  Rest and stay hydrated.  Return to urgent care or PCP if symptoms worsen or fail to resolve.    Final Clinical Impressions(s) / UC Diagnoses   Final diagnoses:  None   Discharge Instructions   None    ED Prescriptions   None    PDMP not reviewed this encounter.   Landis Martins, [place] 04/18/23 [phone]

## 2023-04-18 NOTE — ED Triage Notes (Signed)
 Patient c/o sinus congestion x 2 days.  Patient states that she has used Flonase and saline nasal spray with no relief.

## 2023-04-18 NOTE — Discharge Instructions (Addendum)
 Symptoms are most consistent with a sinus infection. Vital signs are good and lungs are clear. We can treat this with the following:  Azithromycin 250mg  Take 2 tablets today and the 1 tablet daily for 4 more days. This is an antibiotic. Take this with food.  Prednisone 40 mg (2 tablets) once daily for 5 days. Take this in the morning.  This is a steroid to help with inflammation and pain.  Promethazine DM 5 mL every 8 hours as needed for cough.  Use caution as this medication can cause drowsiness. Continue to use flonase daily for nasal congestion.  Rest and stay hydrated.  Return to urgent care or PCP if symptoms worsen or fail to resolve.

## 2023-05-16 ENCOUNTER — Other Ambulatory Visit: Payer: Self-pay | Admitting: Internal Medicine

## 2023-05-16 DIAGNOSIS — Z1231 Encounter for screening mammogram for malignant neoplasm of breast: Secondary | ICD-10-CM

## 2023-06-01 ENCOUNTER — Other Ambulatory Visit: Payer: Self-pay | Admitting: Internal Medicine

## 2023-06-03 ENCOUNTER — Ambulatory Visit
Admission: RE | Admit: 2023-06-03 | Discharge: 2023-06-03 | Disposition: A | Discharge status: Home / Self Care | Source: Ambulatory Visit | Attending: Internal Medicine | Admitting: Internal Medicine

## 2023-06-03 DIAGNOSIS — Z1231 Encounter for screening mammogram for malignant neoplasm of breast: Secondary | ICD-10-CM

## 2023-06-13 ENCOUNTER — Other Ambulatory Visit: Payer: Self-pay | Admitting: Internal Medicine

## 2023-06-13 NOTE — Telephone Encounter (Signed)
 Copied from CRM [id number]. Topic: Clinical - Medication Refill >> Jun 13, 2023 12:57 PM Albertha Alosa wrote: Most Recent Primary Care Visit:  Provider: Linnell Richardson T  Department: [facility]  Visit Type: NURSE VISIT  Date: 03/22/2023  Medication: cetirizine (ZYRTEC) 10 MG tablet  Has the patient contacted their pharmacy? Yes (Agent: If no, request that the patient contact the pharmacy for the refill. If patient does not wish to contact the pharmacy document the reason why and proceed with request.) (Agent: If yes, when and what did the pharmacy advise?)  Is this the correct pharmacy for this prescription? Yes If no, delete pharmacy and type the correct one.  This is the patient's preferred pharmacy:  [id number] [place] Nestle, [place] - [address] AT [facility] [address] [place] [address] Phone: [phone] Fax: [phone]  [facility] - [place], [place] - [address] [place] [address] Phone: [phone] Fax: [phone]  [facility] [address], [place] - [address] [place] [address] Phone: [phone] Fax: [phone]   Has the prescription been filled recently? No  Is the patient out of the medication? Yes  Has the patient been seen for an appointment in the last year OR does the patient have an upcoming appointment? Yes  Can we respond through MyChart? Yes  Agent: Please be advised that Rx refills may take up to 3 business days. We ask that you follow-up with your pharmacy.

## 2023-06-14 MED ORDER — CETIRIZINE HCL 10 MG PO TABS
10.0000 mg | ORAL_TABLET | Freq: Every day | ORAL | 2 refills | Status: DC
Start: 2023-06-14 — End: 2023-09-01

## 2023-06-19 ENCOUNTER — Ambulatory Visit (HOSPITAL_COMMUNITY)
Admission: RE | Admit: 2023-06-19 | Discharge: 2023-06-19 | Disposition: A | Discharge status: Home / Self Care | Source: Ambulatory Visit | Attending: Physician Assistant | Admitting: Physician Assistant

## 2023-06-19 ENCOUNTER — Encounter (HOSPITAL_COMMUNITY): Payer: Self-pay

## 2023-06-19 VITALS — BP 140/84 | HR 77 | Temp 98.5°F | Resp 20

## 2023-06-19 DIAGNOSIS — J209 Acute bronchitis, unspecified: Secondary | ICD-10-CM

## 2023-06-19 DIAGNOSIS — J40 Bronchitis, not specified as acute or chronic: Secondary | ICD-10-CM | POA: Diagnosis not present

## 2023-06-19 MED ORDER — ALBUTEROL SULFATE HFA 108 (90 BASE) MCG/ACT IN AERS: 1.0000 | INHALATION_SPRAY | Freq: Four times a day (QID) | RESPIRATORY_TRACT | 0 refills | Status: DC | PRN

## 2023-06-19 MED ORDER — IPRATROPIUM-ALBUTEROL 0.5-2.5 (3) MG/3ML IN SOLN
RESPIRATORY_TRACT | Status: AC
  Filled 2023-06-19: qty 3

## 2023-06-19 MED ORDER — IPRATROPIUM-ALBUTEROL 0.5-2.5 (3) MG/3ML IN SOLN
3.0000 mL | Freq: Once | RESPIRATORY_TRACT | Status: AC
  Administered 2023-06-19: 3 mL via RESPIRATORY_TRACT

## 2023-06-19 MED ORDER — PREDNISONE 20 MG PO TABS: 40.0000 mg | ORAL_TABLET | Freq: Every day | ORAL | 0 refills | Status: AC

## 2023-06-19 NOTE — ED Provider Notes (Signed)
 [facility]    CSN: [id number] Arrival date & time: 06/19/23  1341      History   Chief Complaint Chief Complaint  Patient presents with   Cough   Nasal Congestion   Wheezing    HPI Jasmine Harrington is a 68 y.o. female.   Patient here today for evaluation of cough, congestion, wheezing that started last Monday.  She reports that she has been taking her allergy medicine without relief.  She notes she has an albuterol  inhaler but has not been using it because it was out of date.  She does have history of bronchitis.  She has not had fever.  The history is provided by the patient.  Cough Associated symptoms: wheezing   Associated symptoms: no chills, no eye discharge and no fever   Wheezing Associated symptoms: cough   Associated symptoms: no fever     Past Medical History:  Diagnosis Date   Arthritis    Hypertension    Pneumonia    Tuberculosis    pt was pos for tb . had to take 6 months of meds .     Patient Active Problem List   Diagnosis Date Noted   Allergic rhinitis with postnasal drip 03/03/2023   Other abnormal glucose 08/31/2022   Pure hypercholesterolemia 08/31/2022   Estrogen deficiency 08/11/2020   Class 3 severe obesity due to excess calories with serious comorbidity and body mass index (BMI) of 50.0 to 59.9 in adult [facility]) 03/26/2019   Encounter for Medicare annual wellness exam 06/22/2017   Paresthesia 07/07/2016   Essential hypertension 12/14/2011    Past Surgical History:  Procedure Laterality Date   ABDOMINAL HYSTERECTOMY  2008   COLONOSCOPY  10/2017   KNEE SURGERY Right 1965    OB History     Gravida  2   Para  1   Term  1   Preterm      AB  1   Living  1      SAB  1   IAB      Ectopic      Multiple      Live Births               Home Medications    Prior to Admission medications   Medication Sig Start Date End Date Taking? Authorizing Provider  amLODipine  (NORVASC ) 2.5 MG tablet Take 1 tablet (2.5  mg total) by mouth daily. 03/03/23 03/02/24 Yes Cleave Curling, MD  atorvastatin  (LIPITOR) 10 MG tablet TAKE 1 TABLET ON MONDAY THROUGH FRIDAY, SKIP WEEKENDS 03/16/23  Yes Cleave Curling, MD  azelastine  (ASTELIN ) 0.1 % nasal spray Place 1 spray into both nostrils 2 (two) times daily. Use in each nostril as directed 12/19/17  Yes Cleave Curling, MD  cetirizine  (ZYRTEC ) 10 MG tablet Take 1 tablet (10 mg total) by mouth daily. 06/14/23  Yes Cleave Curling, MD  cholecalciferol (VITAMIN D ) 1000 UNITS tablet Take 1,000 Units by mouth daily.    Yes [provider]  fluticasone  (FLONASE ) 50 MCG/ACT nasal spray Place 1 spray into both nostrils in the morning and at bedtime for 7 days. 08/11/20 06/19/23 Yes Cleave Curling, MD  montelukast  (SINGULAIR ) 10 MG tablet TAKE 1 TABLET(10 MG) BY MOUTH DAILY 06/01/23  Yes Cleave Curling, MD  olmesartan  (BENICAR ) 40 MG tablet Take 1 tablet (40 mg total) by mouth daily. 03/16/23  Yes Cleave Curling, MD  omega-3 acid ethyl esters (LOVAZA) 1 G capsule Take 1 g by mouth daily.  Yes [provider]  predniSONE  (DELTASONE ) 20 MG tablet Take 2 tablets (40 mg total) by mouth daily with breakfast for 5 days. 06/19/23 06/24/23 Yes Vernestine Gondola, PA-C  VITAMIN E PO Take by mouth.   Yes [provider]  albuterol  (VENTOLIN  HFA) 108 (90 Base) MCG/ACT inhaler Inhale 1-2 puffs into the lungs every 6 (six) hours as needed for wheezing or shortness of breath. 06/19/23   Vernestine Gondola, PA-C  azithromycin  (ZITHROMAX ) 250 MG tablet Take 1 tablet (250 mg total) by mouth daily. Take first 2 tablets together, then 1 every day until finished. 04/18/23   White, Elizabeth A, PA-C  hydrochlorothiazide  (MICROZIDE ) 12.5 MG capsule Take 1 capsule (12.5 mg total) by mouth daily. 03/16/23   Cleave Curling, MD  promethazine -dextromethorphan (PROMETHAZINE -DM) 6.25-15 MG/5ML syrup Take 5 mLs by mouth every 8 (eight) hours as needed for cough. 04/18/23   White, Elizabeth A, PA-C   Semaglutide -Weight Management (WEGOVY ) 0.5 MG/0.5ML SOAJ Inject 0.5 mg into the skin once a week. 03/03/23   Cleave Curling, MD  sodium chloride (OCEAN) 0.65 % SOLN nasal spray Place 1 spray into both nostrils as needed for congestion. 10/26/19   Darr, Derwood Flor, PA-C    Family History Family History  Problem Relation Age of Onset   Hypertension Paternal Grandfather    Hypertension Paternal Grandmother    Hypertension Maternal Grandmother    Hypertension Maternal Grandfather    Prostate cancer Father    Breast cancer Mother    Leukemia Brother     Social History Social History   Tobacco Use   Smoking status: Never   Smokeless tobacco: Never  Vaping Use   Vaping status: Never Used  Substance Use Topics   Alcohol use: No   Drug use: No     Allergies   Aspirin and Penicillins   Review of Systems Review of Systems  Constitutional:  Negative for chills and fever.  HENT:  Positive for congestion.   Eyes:  Negative for discharge and redness.  Respiratory:  Positive for cough and wheezing.   Gastrointestinal:  Negative for diarrhea and vomiting.     Physical Exam Triage Vital Signs ED Triage Vitals  Encounter Vitals Group     BP 06/19/23 1357 (!) 140/84     Systolic BP Percentile --      Diastolic BP Percentile --      Pulse Rate 06/19/23 1357 77     Resp 06/19/23 1357 20     Temp 06/19/23 1357 98.5 F (36.9 C)     Temp Source 06/19/23 1357 Oral     SpO2 06/19/23 1357 94 %     Weight --      Height --      Head Circumference --      Peak Flow --      Pain Score 06/19/23 1356 0     Pain Loc --      Pain Education --      Exclude from Growth Chart --    No data found.  Updated Vital Signs BP (!) 140/84 (BP Location: Left Arm)   Pulse 77   Temp 98.5 F (36.9 C) (Oral)   Resp 20   SpO2 94%   Visual Acuity Right Eye Distance:   Left Eye Distance:   Bilateral Distance:    Right Eye Near:   Left Eye Near:    Bilateral Near:     Physical Exam Vitals  and nursing note reviewed.  Constitutional:  General: She is not in acute distress.    Appearance: Normal appearance. She is not ill-appearing.  HENT:     Head: Normocephalic and atraumatic.     Nose: Congestion present.     Mouth/Throat:     Mouth: Mucous membranes are moist.     Pharynx: No oropharyngeal exudate or posterior oropharyngeal erythema.  Eyes:     Conjunctiva/sclera: Conjunctivae normal.  Cardiovascular:     Rate and Rhythm: Normal rate and regular rhythm.     Heart sounds: Normal heart sounds. No murmur heard. Pulmonary:     Effort: Pulmonary effort is normal. No respiratory distress.     Breath sounds: Wheezing (audible wheezes initially- mild) present. No rhonchi or rales.  Skin:    General: Skin is warm and dry.  Neurological:     Mental Status: She is alert.  Psychiatric:        Mood and Affect: Mood normal.        Thought Content: Thought content normal.      UC Treatments / Results  Labs (all labs ordered are listed, but only abnormal results are displayed) Labs Reviewed - No data to display  EKG   Radiology No results found.  Procedures Procedures (including critical care time)  Medications Ordered in UC Medications  ipratropium-albuterol  (DUONEB) 0.5-2.5 (3) MG/3ML nebulizer solution 3 mL (3 mLs Nebulization Given 06/19/23 1403)    Initial Impression / Assessment and Plan / UC Course  I have reviewed the triage vital signs and the nursing notes.  Pertinent labs & imaging results that were available during my care of the patient were reviewed by me and considered in my medical decision making (see chart for details).    Nebulizer treatment administered in office with improvement of wheezing and shortness of breath.  Vital stable.  Will treat to cover bronchitis with albuterol  and steroid burst.  Advise follow-up if no gradual improvement with any further concerns.  Final Clinical Impressions(s) / UC Diagnoses   Final diagnoses:   Bronchitis   Discharge Instructions   None    ED Prescriptions     Medication Sig Dispense Auth. Provider   predniSONE  (DELTASONE ) 20 MG tablet Take 2 tablets (40 mg total) by mouth daily with breakfast for 5 days. 10 tablet Jami Mcclintock F, PA-C   albuterol  (VENTOLIN  HFA) 108 (90 Base) MCG/ACT inhaler Inhale 1-2 puffs into the lungs every 6 (six) hours as needed for wheezing or shortness of breath. 8 g Vernestine Gondola, PA-C      PDMP not reviewed this encounter.   Vernestine Gondola, PA-C 06/19/23 [phone]

## 2023-06-19 NOTE — ED Triage Notes (Signed)
 Pt states she has cough, congestion, wheezing since last Monday. She has been taking her allergy meds. She has an albuterol  MDI but she dint use it since it was out of date.

## 2023-09-01 ENCOUNTER — Ambulatory Visit (INDEPENDENT_AMBULATORY_CARE_PROVIDER_SITE_OTHER): Payer: Self-pay | Admitting: Internal Medicine

## 2023-09-01 ENCOUNTER — Encounter: Payer: Self-pay | Admitting: Internal Medicine

## 2023-09-01 ENCOUNTER — Other Ambulatory Visit: Payer: Self-pay

## 2023-09-01 VITALS — BP 134/86 | HR 91 | Temp 98.4°F | Ht 60.0 in | Wt 269.6 lb

## 2023-09-01 DIAGNOSIS — E2839 Other primary ovarian failure: Secondary | ICD-10-CM

## 2023-09-01 DIAGNOSIS — E7800 Pure hypercholesterolemia, unspecified: Secondary | ICD-10-CM

## 2023-09-01 DIAGNOSIS — R0982 Postnasal drip: Secondary | ICD-10-CM | POA: Diagnosis not present

## 2023-09-01 DIAGNOSIS — E66813 Obesity, class 3: Secondary | ICD-10-CM

## 2023-09-01 DIAGNOSIS — N951 Menopausal and female climacteric states: Secondary | ICD-10-CM

## 2023-09-01 DIAGNOSIS — R9230 Dense breasts, unspecified: Secondary | ICD-10-CM

## 2023-09-01 DIAGNOSIS — R7309 Other abnormal glucose: Secondary | ICD-10-CM | POA: Diagnosis not present

## 2023-09-01 DIAGNOSIS — Z0000 Encounter for general adult medical examination without abnormal findings: Secondary | ICD-10-CM

## 2023-09-01 DIAGNOSIS — Z6841 Body Mass Index (BMI) 40.0 and over, adult: Secondary | ICD-10-CM

## 2023-09-01 DIAGNOSIS — J309 Allergic rhinitis, unspecified: Secondary | ICD-10-CM

## 2023-09-01 DIAGNOSIS — I10 Essential (primary) hypertension: Secondary | ICD-10-CM

## 2023-09-01 LAB — POCT URINALYSIS DIP (CLINITEK)
Bilirubin, UA: NEGATIVE
Blood, UA: NEGATIVE
Glucose, UA: NEGATIVE mg/dL
Ketones, POC UA: NEGATIVE mg/dL
Leukocytes, UA: NEGATIVE
Nitrite, UA: NEGATIVE
POC PROTEIN,UA: NEGATIVE
Spec Grav, UA: 1.01 (ref 1.010–1.025)
Urobilinogen, UA: 0.2 U/dL
pH, UA: 5.5 (ref 5.0–8.0)

## 2023-09-01 MED ORDER — MONTELUKAST SODIUM 10 MG PO TABS: 10.0000 mg | ORAL_TABLET | Freq: Every day | ORAL | 2 refills | Status: DC

## 2023-09-01 MED ORDER — AZELASTINE HCL 0.1 % NA SOLN
1.0000 | Freq: Two times a day (BID) | NASAL | 3 refills | Status: AC
Start: 2023-09-01 — End: ?

## 2023-09-01 MED ORDER — ATORVASTATIN CALCIUM 10 MG PO TABS: ORAL_TABLET | ORAL | 3 refills | Status: AC

## 2023-09-01 MED ORDER — HYDROCHLOROTHIAZIDE 12.5 MG PO CAPS: 12.5000 mg | ORAL_CAPSULE | Freq: Every day | ORAL | 1 refills | Status: DC

## 2023-09-01 MED ORDER — CETIRIZINE HCL 10 MG PO TABS: 10.0000 mg | ORAL_TABLET | Freq: Every day | ORAL | 2 refills | Status: DC

## 2023-09-01 MED ORDER — FLUTICASONE PROPIONATE 50 MCG/ACT NA SUSP: 1.0000 | Freq: Two times a day (BID) | NASAL | 0 refills | Status: AC

## 2023-09-01 MED ORDER — OLMESARTAN MEDOXOMIL 40 MG PO TABS
40.0000 mg | ORAL_TABLET | Freq: Every day | ORAL | 2 refills | Status: AC
Start: 2023-09-01 — End: ?

## 2023-09-01 MED ORDER — LEVOCETIRIZINE DIHYDROCHLORIDE 5 MG PO TABS: 5.0000 mg | ORAL_TABLET | Freq: Every evening | ORAL | 1 refills | Status: DC

## 2023-09-01 MED ORDER — OLMESARTAN MEDOXOMIL 40 MG PO TABS: 40.0000 mg | ORAL_TABLET | Freq: Every day | ORAL | 2 refills | Status: DC

## 2023-09-01 MED ORDER — AMLODIPINE BESYLATE 2.5 MG PO TABS: 2.5000 mg | ORAL_TABLET | Freq: Every day | ORAL | 11 refills | Status: DC

## 2023-09-01 MED ORDER — WEGOVY 0.5 MG/0.5ML ~~LOC~~ SOAJ: 0.5000 mg | SUBCUTANEOUS | 0 refills | Status: DC

## 2023-09-01 NOTE — Patient Instructions (Signed)

## 2023-09-01 NOTE — Progress Notes (Signed)
 I,Jasmine Harrington, CMA,acting as a Neurosurgeon for Jasmine LOISE Slocumb, MD.,have documented all relevant documentation on the behalf of Jasmine LOISE Slocumb, MD,as directed by  Jasmine LOISE Slocumb, MD while in the presence of Jasmine LOISE Slocumb, MD.  Subjective:    Patient ID: Jasmine Harrington , female    DOB: 1955/06/14 , 68 y.o.   MRN: [id number]  Chief Complaint  Patient presents with   Annual Exam    Patient presents today for annual exam. She reports compliance with medications. Denies headache, chest pain & sob. She has no specific questions or concerns.    Hypertension   Hyperlipidemia    HPI Discussed the use of AI scribe software for clinical note transcription with the patient, who gave verbal consent to proceed.  History of Present Illness Jasmine Harrington is a 68 year old female who presents for an annual physical exam and blood pressure check.  She experiences ongoing nasal drainage at night despite using Zyrtec  10 mg daily in the morning and Singulair  at night. She mostly avoids dairy, except for occasional cheeseburgers and cereal with milk, and does not consume yogurt or milk regularly. Her current medications include albuterol , amlodipine  2.5 mg, atorvastatin  10 mg (taken Monday through Friday), Astelin  nasal spray as needed, hydrochlorothiazide , and olmesartan  40 mg. Insurance did not approve [employer] .  She mentions experiencing hot flashes and inquires about her relation to age. She tries to eat less processed food and drinks water throughout the day. Breakfast typically includes cereal, lunch is often leftovers, and dinner varies. She enjoys fruits like oranges and cherries.  She exercises using machines that allow her to sit, doing so for 30 minutes, five days a week. She does not weigh herself regularly but acknowledges a weight gain of six pounds.  She had a mammogram in February or March, which indicated dense breast tissue but no significant concerns. She performs regular breast exams and  reports daily bowel movements.   Hypertension This is a chronic problem. The current episode started more than 1 year ago. The problem has been gradually improving since onset. The problem is controlled. Pertinent negatives include no blurred vision. Past treatments include angiotensin blockers and diuretics. The current treatment provides moderate improvement. Compliance problems include exercise.  Hypertensive end-organ damage includes kidney disease.     Past Medical History:  Diagnosis Date   Arthritis    Hypertension    Pneumonia    Tuberculosis    pt was pos for tb . had to take 6 months of meds .      Family History  Problem Relation Age of Onset   Hypertension Paternal Grandfather    Hypertension Paternal Grandmother    Hypertension Maternal Grandmother    Hypertension Maternal Grandfather    Prostate cancer Father    Breast cancer Mother    Leukemia Brother      Current Outpatient Medications:    albuterol  (VENTOLIN  HFA) 108 (90 Base) MCG/ACT inhaler, Inhale 1-2 puffs into the lungs every 6 (six) hours as needed for wheezing or shortness of breath., Disp: 8 g, Rfl: 0   cholecalciferol (VITAMIN D ) 1000 UNITS tablet, Take 1,000 Units by mouth daily. , Disp: , Rfl:    levocetirizine (XYZAL ) 5 MG tablet, Take 1 tablet (5 mg total) by mouth every evening., Disp: 90 tablet, Rfl: 1   omega-3 acid ethyl esters (LOVAZA) 1 G capsule, Take 1 g by mouth daily., Disp: , Rfl:    promethazine -dextromethorphan (PROMETHAZINE -DM) 6.25-15 MG/5ML syrup, Take  5 mLs by mouth every 8 (eight) hours as needed for cough., Disp: 180 mL, Rfl: 0   sodium chloride (OCEAN) 0.65 % SOLN nasal spray, Place 1 spray into both nostrils as needed for congestion., Disp: 60 mL, Rfl: 0   VITAMIN E PO, Take by mouth., Disp: , Rfl:    amLODipine  (NORVASC ) 2.5 MG tablet, Take 1 tablet (2.5 mg total) by mouth daily., Disp: 30 tablet, Rfl: 11   atorvastatin  (LIPITOR) 10 MG tablet, TAKE 1 TABLET ON MONDAY THROUGH  FRIDAY, SKIP WEEKENDS, Disp: 90 tablet, Rfl: 3   azelastine  (ASTELIN ) 0.1 % nasal spray, Place 1 spray into both nostrils 2 (two) times daily. Use in each nostril as directed, Disp: 90 mL, Rfl: 3   fluticasone  (FLONASE ) 50 MCG/ACT nasal spray, Place 1 spray into both nostrils in the morning and at bedtime for 7 days., Disp: 1 g, Rfl: 0   hydrochlorothiazide  (MICROZIDE ) 12.5 MG capsule, Take 1 capsule (12.5 mg total) by mouth daily., Disp: 90 capsule, Rfl: 1   montelukast  (SINGULAIR ) 10 MG tablet, Take 1 tablet (10 mg total) by mouth at bedtime., Disp: 90 tablet, Rfl: 2   olmesartan  (BENICAR ) 40 MG tablet, Take 1 tablet (40 mg total) by mouth daily., Disp: 90 tablet, Rfl: 2   Allergies  Allergen Reactions   Aspirin     rash   Penicillins Rash      The patient states she uses status post hysterectomy for birth control. No LMP recorded. Patient has had a hysterectomy.. Negative for Dysmenorrhea. Negative for: breast discharge, breast lump(s), breast pain and breast self exam. Associated symptoms include abnormal vaginal bleeding. Pertinent negatives include abnormal bleeding (hematology), anxiety, decreased libido, depression, difficulty falling sleep, dyspareunia, history of infertility, nocturia, sexual dysfunction, sleep disturbances, urinary incontinence, urinary urgency, vaginal discharge and vaginal itching. Diet regular.The patient states her exercise level is  intermittent.  . The patient's tobacco use is:  Social History   Tobacco Use  Smoking Status Never  Smokeless Tobacco Never  . She has been exposed to passive smoke. The patient's alcohol use is:  Social History   Substance and Sexual Activity  Alcohol Use No   Review of Systems  Constitutional: Negative.   HENT:  Positive for postnasal drip.   Eyes: Negative.  Negative for blurred vision.  Respiratory: Negative.    Cardiovascular: Negative.   Gastrointestinal: Negative.   Endocrine: Negative.   Genitourinary: Negative.    Musculoskeletal: Negative.   Skin: Negative.   Allergic/Immunologic: Negative.   Neurological: Negative.   Hematological: Negative.   Psychiatric/Behavioral: Negative.       Today's Vitals   09/01/23 0956  BP: 134/86  Pulse: 91  Temp: 98.4 F (36.9 C)  SpO2: 98%  Weight: 269 lb 9.6 oz (122.3 kg)  Height: 5' (1.524 m)   Body mass index is 52.65 kg/m.  Wt Readings from Last 3 Encounters:  09/01/23 269 lb 9.6 oz (122.3 kg)  03/22/23 263 lb (119.3 kg)  03/03/23 263 lb 6.4 oz (119.5 kg)     Objective:  Physical Exam Vitals and nursing note reviewed.  Constitutional:      Appearance: Normal appearance. She is obese.  HENT:     Head: Normocephalic and atraumatic.     Right Ear: Tympanic membrane, ear canal and external ear normal.     Left Ear: Tympanic membrane, ear canal and external ear normal.     Nose: Nose normal.     Mouth/Throat:     Mouth: Mucous  membranes are moist.     Pharynx: Oropharynx is clear.  Eyes:     Extraocular Movements: Extraocular movements intact.     Conjunctiva/sclera: Conjunctivae normal.     Pupils: Pupils are equal, round, and reactive to light.  Cardiovascular:     Rate and Rhythm: Normal rate and regular rhythm.     Pulses: Normal pulses.     Heart sounds: Normal heart sounds.  Pulmonary:     Effort: Pulmonary effort is normal.     Breath sounds: Normal breath sounds.  Chest:  Breasts:    Tanner Score is 5.     Right: Normal.     Left: Normal.  Abdominal:     General: Bowel sounds are normal.     Palpations: Abdomen is soft.     Comments: Obese, difficult to assess organomegaly due to body habitus  Genitourinary:    Comments: Declined Musculoskeletal:        General: Normal range of motion.     Cervical back: Normal range of motion and neck supple.  Skin:    General: Skin is warm and dry.  Neurological:     General: No focal deficit present.     Mental Status: She is alert and oriented to person, place, and time.      Deep Tendon Reflexes: Reflexes normal.  Psychiatric:        Mood and Affect: Mood normal.        Behavior: Behavior normal.         Assessment And Plan:     Encounter for annual physical exam Assessment & Plan: A full exam was performed.  Importance of monthly self breast exams was discussed with the patient.  She is advised to get 30-45 minutes of regular exercise, no less than four to five days per week. Both weight-bearing and aerobic exercises are recommended.  She is advised to follow a healthy diet with at least six fruits/veggies per day, decrease intake of red meat and other saturated fats and to increase fish intake to twice weekly.  Meats/fish should not be fried -- baked, boiled or broiled is preferable. It is also important to cut back on your sugar intake.  Be sure to read labels - try to avoid anything with added sugar, high fructose corn syrup or other sweeteners.  If you must use a sweetener, you can try stevia or monkfruit.  It is also important to avoid artificially sweetened foods/beverages and diet drinks. Lastly, wear SPF 50 sunscreen on exposed skin and when in direct sunlight for an extended period of time.  Be sure to avoid fast food restaurants and aim for at least 60 ounces of water daily.       Essential hypertension Assessment & Plan: Chronic, fair control. Goal BP<13080.  EKG performed, NSR w/o acute changes.  Emphasized importance of achieving and  maintaining BP target and benefits of exercise. - Continue amlodipine , olmesartan , and hydrochlorothiazide . - Encourage regular blood pressure monitoring. - Advise regular physical activity for blood pressure management  Orders: -     CBC -     CMP14+EGFR -     Lipid panel -     EKG 12-Lead -     POCT URINALYSIS DIP (CLINITEK) -     Microalbumin / creatinine urine ratio  Pure hypercholesterolemia Assessment & Plan: On atorvastatin . Previous cholesterol levels checked in January. Encouraged lifestyle  modifications. - Continue atorvastatin  as prescribed. - Encourage regular exercise for cholesterol management.  Orders: -  Lipid panel  Allergic rhinitis with postnasal drip Assessment & Plan: Persistent nocturnal nasal drainage despite current regimen. Decision to switch to Xyzal  for improved control. Advised to avoid dairy products and consider almond milk. - Prescribe Xyzal  and send prescription to [facility]. - Continue montelukast  (Singulair ) with a 90-day supply. - Advise avoidance of dairy products to reduce nasal drainage. - Suggest switching to almond milk to reduce dairy intake.   Dense breast tissue on mammogram, unspecified type Assessment & Plan: Dense breast tissue confirmed by recent mammogram with no concerning findings. Informed about implications. - Continue regular breast self-exams. - Follow up with routine mammograms as recommended.   Menopausal symptoms Assessment & Plan: Experiencing hot flashes. Reassured she is common. Discussed dietary modifications. - Advise reduction of processed foods to manage hot flashes. General Health Maintenance Discussed dietary habits and exercise. Encouraged weight management. - Encourage consumption of more raw fruits and vegetables daily. - Advise regular physical activity for bone health and weight management. - Encourage weight monitoring and management.   Decreased estrogen level -     DG Bone Density; Future  Other abnormal glucose Assessment & Plan: Previous labs reviewed, her A1c has been elevated in the past. I will check an A1c today. Reminded to avoid refined sugars including sugary drinks/foods and processed meats including bacon, sausages and deli meats.    Orders: -     CMP14+EGFR -     Hemoglobin A1c  Class 3 severe obesity due to excess calories with serious comorbidity and body mass index (BMI) of 50.0 to 59.9 in adult Assessment & Plan: BMI 52. BMI 51. She is advised of 6lb weight gain  over the past six months. She is encouraged to increase her daily activity and to consider participation in [employer] program.    Other orders -     Levocetirizine Dihydrochloride ; Take 1 tablet (5 mg total) by mouth every evening.  Dispense: 90 tablet; Refill: 1 -     Montelukast  Sodium; Take 1 tablet (10 mg total) by mouth at bedtime.  Dispense: 90 tablet; Refill: 2   Return for 1 year physical, 6 month bp. Patient was given opportunity to ask questions. Patient verbalized understanding of the plan and was able to repeat key elements of the plan. All questions were answered to their satisfaction.   I, Jasmine LOISE Slocumb, MD, have reviewed all documentation for this visit. The documentation on 09/01/23 for the exam, diagnosis, procedures, and orders are all accurate and complete.

## 2023-09-03 ENCOUNTER — Ambulatory Visit: Payer: Self-pay | Admitting: Internal Medicine

## 2023-09-03 LAB — CMP14+EGFR
ALT: 22 IU/L (ref 0–32)
AST: 25 IU/L (ref 0–40)
Albumin: 4.5 g/dL (ref 3.9–4.9)
Alkaline Phosphatase: 95 IU/L (ref 44–121)
BUN/Creatinine Ratio: 13 (ref 12–28)
BUN: 14 mg/dL (ref 8–27)
Bilirubin Total: 0.7 mg/dL (ref 0.0–1.2)
CO2: 22 mmol/L (ref 20–29)
Calcium: 9.8 mg/dL (ref 8.7–10.3)
Chloride: 99 mmol/L (ref 96–106)
Creatinine, Ser: 1.05 mg/dL — ABNORMAL HIGH (ref 0.57–1.00)
Globulin, Total: 3.4 g/dL (ref 1.5–4.5)
Glucose: 102 mg/dL — ABNORMAL HIGH (ref 70–99)
Potassium: 3.6 mmol/L (ref 3.5–5.2)
Sodium: 139 mmol/L (ref 134–144)
Total Protein: 7.9 g/dL (ref 6.0–8.5)
eGFR: 59 mL/min/1.73 — ABNORMAL LOW (ref >59)

## 2023-09-03 LAB — CBC
Hematocrit: 43.7 % (ref 34.0–46.6)
Hemoglobin: 14 g/dL (ref 11.1–15.9)
MCH: 29.8 pg (ref 26.6–33.0)
MCHC: 32 g/dL (ref 31.5–35.7)
MCV: 93 fL (ref 79–97)
Platelets: 256 x10E3/uL (ref 150–450)
RBC: 4.7 x10E6/uL (ref 3.77–5.28)
RDW: 12 % (ref 11.7–15.4)
WBC: 7.8 x10E3/uL (ref 3.4–10.8)

## 2023-09-03 LAB — LIPID PANEL
Chol/HDL Ratio: 3.9 ratio (ref 0.0–4.4)
Cholesterol, Total: 151 mg/dL (ref 100–199)
HDL: 39 mg/dL — ABNORMAL LOW (ref >39)
LDL Chol Calc (NIH): 90 mg/dL (ref 0–99)
Triglycerides: 121 mg/dL (ref 0–149)
VLDL Cholesterol Cal: 22 mg/dL (ref 5–40)

## 2023-09-03 LAB — MICROALBUMIN / CREATININE URINE RATIO
Creatinine, Urine: 39 mg/dL
Microalb/Creat Ratio: <8 mg/g{creat} (ref 0–29)
Microalbumin, Urine: <3 ug/mL

## 2023-09-03 LAB — HEMOGLOBIN A1C
Est. average glucose Bld gHb Est-mCnc: 111 mg/dL
Hgb A1c MFr Bld: 5.5 % (ref 4.8–5.6)

## 2023-09-05 DIAGNOSIS — R9230 Dense breasts, unspecified: Secondary | ICD-10-CM | POA: Insufficient documentation

## 2023-09-05 DIAGNOSIS — Z0000 Encounter for general adult medical examination without abnormal findings: Secondary | ICD-10-CM | POA: Insufficient documentation

## 2023-09-05 DIAGNOSIS — N951 Menopausal and female climacteric states: Secondary | ICD-10-CM | POA: Insufficient documentation

## 2023-09-05 NOTE — Assessment & Plan Note (Signed)

## 2023-09-05 NOTE — Assessment & Plan Note (Signed)
 On atorvastatin . Previous cholesterol levels checked in January. Encouraged lifestyle modifications. - Continue atorvastatin  as prescribed. - Encourage regular exercise for cholesterol management.

## 2023-09-05 NOTE — Assessment & Plan Note (Signed)
 Experiencing hot flashes. Reassured she is common. Discussed dietary modifications. - Advise reduction of processed foods to manage hot flashes. General Health Maintenance Discussed dietary habits and exercise. Encouraged weight management. - Encourage consumption of more raw fruits and vegetables daily. - Advise regular physical activity for bone health and weight management. - Encourage weight monitoring and management.

## 2023-09-05 NOTE — Assessment & Plan Note (Signed)
 Dense breast tissue confirmed by recent mammogram with no concerning findings. Informed about implications. - Continue regular breast self-exams. - Follow up with routine mammograms as recommended.

## 2023-09-05 NOTE — Assessment & Plan Note (Addendum)
 Chronic, fair control. Goal BP<13080.  EKG performed, NSR w/o acute changes.  Emphasized importance of achieving and  maintaining BP target and benefits of exercise. - Continue amlodipine , olmesartan , and hydrochlorothiazide . - Encourage regular blood pressure monitoring. - Advise regular physical activity for blood pressure management

## 2023-09-05 NOTE — Assessment & Plan Note (Signed)
 Previous labs reviewed, her A1c has been elevated in the past. I will check an A1c today. Reminded to avoid refined sugars including sugary drinks/foods and processed meats including bacon, sausages and deli meats.

## 2023-09-05 NOTE — Assessment & Plan Note (Signed)
 BMI 52. BMI 51. She is advised of 6lb weight gain over the past six months. She is encouraged to increase her daily activity and to consider participation in [employer].

## 2023-09-05 NOTE — Assessment & Plan Note (Signed)
 Persistent nocturnal nasal drainage despite current regimen. Decision to switch to Xyzal  for improved control. Advised to avoid dairy products and consider almond milk. - Prescribe Xyzal  and send prescription to [facility]. - Continue montelukast  (Singulair ) with a 90-day supply. - Advise avoidance of dairy products to reduce nasal drainage. - Suggest switching to almond milk to reduce dairy intake.

## 2023-09-10 ENCOUNTER — Other Ambulatory Visit: Payer: Self-pay | Admitting: Internal Medicine

## 2023-09-12 ENCOUNTER — Other Ambulatory Visit: Payer: Self-pay

## 2023-09-12 ENCOUNTER — Other Ambulatory Visit: Payer: Self-pay | Admitting: Internal Medicine

## 2023-09-12 DIAGNOSIS — I10 Essential (primary) hypertension: Secondary | ICD-10-CM

## 2023-09-12 MED ORDER — AMLODIPINE BESYLATE 2.5 MG PO TABS: 2.5000 mg | ORAL_TABLET | Freq: Every day | ORAL | 2 refills | Status: DC

## 2023-10-05 ENCOUNTER — Ambulatory Visit (INDEPENDENT_AMBULATORY_CARE_PROVIDER_SITE_OTHER)

## 2023-10-05 VITALS — BP 120/70 | HR 85 | Temp 98.4°F | Ht 60.0 in | Wt 274.0 lb

## 2023-10-05 DIAGNOSIS — Z0000 Encounter for general adult medical examination without abnormal findings: Secondary | ICD-10-CM | POA: Diagnosis not present

## 2023-10-05 NOTE — Progress Notes (Signed)
 Subjective:   Jasmine Harrington is a 68 y.o. who presents for a Medicare Wellness preventive visit.  As a reminder, Annual Wellness Visits don't include a physical exam, and some assessments may be limited, especially if this visit is performed virtually. We may recommend an in-person follow-up visit with your provider if needed.  Visit Complete: In person    Persons Participating in Visit: Patient.  AWV Questionnaire: No: Patient Medicare AWV questionnaire was not completed prior to this visit.  Cardiac Risk Factors include: advanced age (>43men, >53 women);hypertension     Objective:    Today's Vitals   10/05/23 1615 10/05/23 1632  BP: 138/70 120/70  Pulse: 85   Temp: 98.4 F (36.9 C)   TempSrc: Oral   SpO2: 97%   Weight: 274 lb (124.3 kg)   Height: 5' (1.524 m)    Body mass index is 53.51 kg/m.     10/05/2023    4:22 PM 08/31/2022    9:44 AM 10/26/2019    5:30 PM  Advanced Directives  Does Patient Have a Medical Advance Directive? No No No  Would patient like information on creating a medical advance directive? No - Patient declined Yes (MAU/Ambulatory/Procedural Areas - Information given) No - Patient declined    Current Medications (verified) Outpatient Encounter Medications as of 10/05/2023  Medication Sig   albuterol  (VENTOLIN  HFA) 108 (90 Base) MCG/ACT inhaler Inhale 1-2 puffs into the lungs every 6 (six) hours as needed for wheezing or shortness of breath.   amLODipine  (NORVASC ) 2.5 MG tablet Take 1 tablet (2.5 mg total) by mouth daily.   atorvastatin  (LIPITOR) 10 MG tablet TAKE 1 TABLET ON MONDAY THROUGH FRIDAY, SKIP WEEKENDS   azelastine  (ASTELIN ) 0.1 % nasal spray Place 1 spray into both nostrils 2 (two) times daily. Use in each nostril as directed   cholecalciferol (VITAMIN D ) 1000 UNITS tablet Take 1,000 Units by mouth daily.    fluticasone  (FLONASE ) 50 MCG/ACT nasal spray Place 1 spray into both nostrils in the morning and at bedtime for 7 days.    hydrochlorothiazide  (MICROZIDE ) 12.5 MG capsule Take 1 capsule (12.5 mg total) by mouth daily.   levocetirizine (XYZAL ) 5 MG tablet Take 1 tablet (5 mg total) by mouth every evening.   montelukast  (SINGULAIR ) 10 MG tablet TAKE 1 TABLET(10 MG) BY MOUTH DAILY   olmesartan  (BENICAR ) 40 MG tablet Take 1 tablet (40 mg total) by mouth daily.   omega-3 acid ethyl esters (LOVAZA) 1 G capsule Take 1 g by mouth daily.   sodium chloride (OCEAN) 0.65 % SOLN nasal spray Place 1 spray into both nostrils as needed for congestion.   VITAMIN E PO Take by mouth.   promethazine -dextromethorphan (PROMETHAZINE -DM) 6.25-15 MG/5ML syrup Take 5 mLs by mouth every 8 (eight) hours as needed for cough. (Patient not taking: Reported on 10/05/2023)   No facility-administered encounter medications on file as of 10/05/2023.    Allergies (verified) Aspirin and Penicillins   History: Past Medical History:  Diagnosis Date   Arthritis    Hypertension    Pneumonia    Tuberculosis    pt was pos for tb . had to take 6 months of meds .    Past Surgical History:  Procedure Laterality Date   ABDOMINAL HYSTERECTOMY  2008   COLONOSCOPY  10/2017   KNEE SURGERY Right 1965   Family History  Problem Relation Age of Onset   Hypertension Paternal Grandfather    Hypertension Paternal Grandmother    Hypertension Maternal Grandmother  Hypertension Maternal Grandfather    Prostate cancer Father    Breast cancer Mother    Leukemia Brother    Social History   Socioeconomic History   Marital status: Married    Spouse name: Not on file   Number of children: Not on file   Years of education: Not on file   Highest education level: Not on file  Occupational History   Not on file  Tobacco Use   Smoking status: Never   Smokeless tobacco: Never  Vaping Use   Vaping status: Never Used  Substance and Sexual Activity   Alcohol use: No   Drug use: No   Sexual activity: Yes    Birth control/protection: Surgical    Comment:  HYST   Other Topics Concern   Not on file  Social History Narrative   Not on file   Social Drivers of Health   Financial Resource Strain: Low Risk  (10/05/2023)   Overall Financial Resource Strain (CARDIA)    Difficulty of Paying Living Expenses: Not hard at all  Food Insecurity: No Food Insecurity (10/05/2023)   Hunger Vital Sign    Worried About Running Out of Food in the Last Year: Never true    Ran Out of Food in the Last Year: Never true  Transportation Needs: No Transportation Needs (10/05/2023)   PRAPARE - Administrator, Civil Service (Medical): No    Lack of Transportation (Non-Medical): No  Physical Activity: Sufficiently Active (10/05/2023)   Exercise Vital Sign    Days of Exercise per Week: 5 days    Minutes of Exercise per Session: 30 min  Stress: No Stress Concern Present (10/05/2023)   [employer] of Occupational Health - Occupational Stress Questionnaire    Feeling of Stress: Not at all  Social Connections: Moderately Integrated (10/05/2023)   Social Connection and Isolation Panel    Frequency of Communication with Friends and Family: More than three times a week    Frequency of Social Gatherings with Friends and Family: More than three times a week    Attends Religious Services: 1 to 4 times per year    Active Member of [employer] or Organizations: No    Attends Banker Meetings: Never    Marital Status: Married    Tobacco Counseling Counseling given: Not Answered    Clinical Intake:  Pre-visit preparation completed: Yes  Pain : No/denies pain     Nutritional Status: BMI > 30  Obese Nutritional Risks: None Diabetes: No  Lab Results  Component Value Date   HGBA1C 5.5 09/01/2023   HGBA1C 5.7 (H) 03/03/2023   HGBA1C 5.7 (H) 08/31/2022     How often do you need to have someone help you when you read instructions, pamphlets, or other written materials from your doctor or pharmacy?: 1 - Never  Interpreter Needed?: No  Information  entered by :: NAllen LPN   Activities of Daily Living     10/05/2023    4:16 PM  In your present state of health, do you have any difficulty performing the following activities:  Hearing? 0  Vision? 0  Difficulty concentrating or making decisions? 0  Walking or climbing stairs? 0  Dressing or bathing? 0  Doing errands, shopping? 0  Preparing Food and eating ? N  Using the Toilet? N  In the past six months, have you accidently leaked urine? N  Do you have problems with loss of bowel control? N  Managing your Medications? N  Managing  your Finances? N  Housekeeping or managing your Housekeeping? N    Patient Care Team: Jarold Medici, MD as PCP - General (Internal Medicine)  I have updated your Care Teams any recent Medical Services you may have received from other providers in the past year.     Assessment:   This is a routine wellness examination for Jasmine Harrington.  Hearing/Vision screen Hearing Screening - Comments:: Denies hearing issues Vision Screening - Comments:: Regular eye exams, [facility]   Goals Addressed             This Visit's Progress    Patient Stated       10/05/2023, wants to lose weight       Depression Screen     10/05/2023    4:24 PM 09/01/2023   10:03 AM 08/31/2022    9:12 AM 08/17/2021    9:47 AM 08/11/2020    9:38 AM 08/06/2019   11:30 AM 07/25/2018    8:48 AM  PHQ 2/9 Scores  PHQ - 2 Score 0 0 0 0 0 0 0  PHQ- 9 Score 0 0 0        Fall Risk     10/05/2023    4:23 PM 09/01/2023   10:03 AM 03/03/2023    8:50 AM 08/31/2022    9:12 AM 02/16/2022    9:39 AM  Fall Risk   Falls in the past year? 0 0 0 0 0  Number falls in past yr: 0 0 0 0 0  Injury with Fall? 0 0 0 0 0  Risk for fall due to : Medication side effect No Fall Risks No Fall Risks No Fall Risks No Fall Risks  Follow up Falls evaluation completed;Falls prevention discussed Falls evaluation completed Falls evaluation completed Falls evaluation completed Falls evaluation completed       Data saved with a previous flowsheet row definition    MEDICARE RISK AT HOME:  Medicare Risk at Home Any stairs in or around the home?: Yes If so, are there any without handrails?: No Home free of loose throw rugs in walkways, pet beds, electrical cords, etc?: Yes Adequate lighting in your home to reduce risk of falls?: Yes Life alert?: No Use of a cane, walker or w/c?: No Grab bars in the bathroom?: No Shower chair or bench in shower?: No Elevated toilet seat or a handicapped toilet?: Yes  TIMED UP AND GO:  Was the test performed?  Yes  Length of time to ambulate 10 feet: 5 sec Gait steady and fast without use of assistive device  Cognitive Function: 6CIT completed        10/05/2023    4:24 PM 08/31/2022    9:17 AM  6CIT Screen  What Year? 0 points 0 points  What month? 0 points 0 points  What time? 0 points 0 points  Count back from 20 0 points 0 points  Months in reverse 0 points 0 points  Repeat phrase 2 points 2 points  Total Score 2 points 2 points    Immunizations Immunization History  Administered Date(s) Administered   DTaP 11/27/2012   Fluad Quad(high Dose 65+) 03/09/2022   Fluad Trivalent(High Dose 65+) 01/17/2023   Influenza Inj Mdck Quad With Preservative 12/15/2017   Influenza,inj,Quad PF,6+ Mos 11/27/2018, 02/05/2020, 02/17/2021   Influenza-Unspecified 12/15/2017   Moderna Sars-Covid-2 Vaccination 06/13/2019, 07/17/2019, 02/27/2020   PNEUMOCOCCAL CONJUGATE-20 02/16/2022   Zoster Recombinant(Shingrix ) 03/26/2019, 05/25/2019    Screening Tests Health Maintenance  Topic Date Due  COVID-19 Vaccine (4 - 2024-25 season) 10/31/2022   INFLUENZA VACCINE  09/30/2023   DTaP/Tdap/Td (2 - Tdap) 08/31/2024 (Originally 11/28/2022)   Medicare Annual Wellness (AWV)  10/04/2024   MAMMOGRAM  06/02/2025   Colonoscopy  11/12/2027   Pneumococcal Vaccine: 50+ Years  Completed   DEXA SCAN  Completed   Hepatitis C Screening  Completed   Zoster Vaccines- Shingrix    Completed   Hepatitis B Vaccines  Aged Out   HPV VACCINES  Aged Out   Meningococcal B Vaccine  Aged Out    Health Maintenance  Health Maintenance Due  Topic Date Due   COVID-19 Vaccine (4 - 2024-25 season) 10/31/2022   INFLUENZA VACCINE  09/30/2023   Health Maintenance Items Addressed: Due for flu and covid vaccine.  Additional Screening:  Vision Screening: Recommended annual ophthalmology exams for early detection of glaucoma and other disorders of the eye. Would you like a referral to an eye doctor? No    Dental Screening: Recommended annual dental exams for proper oral hygiene  Community Resource Referral / Chronic Care Management: CRR required this visit?  No   CCM required this visit?  No   Plan:    I have personally reviewed and noted the following in the patient's chart:   Medical and social history Use of alcohol, tobacco or illicit drugs  Current medications and supplements including opioid prescriptions. Patient is not currently taking opioid prescriptions. Functional ability and status Nutritional status Physical activity Advanced directives List of other physicians Hospitalizations, surgeries, and ER visits in previous 12 months Vitals Screenings to include cognitive, depression, and falls Referrals and appointments  In addition, I have reviewed and discussed with patient certain preventive protocols, quality metrics, and best practice recommendations. A written personalized care plan for preventive services as well as general preventive health recommendations were provided to patient.   Jasmine FORBES Dawn, LPN   03/03/7972   After Visit Summary: (In Person-Printed) AVS printed and given to the patient  Notes: Nothing significant to report at this time.

## 2023-10-05 NOTE — Patient Instructions (Signed)
 Jasmine Harrington , Thank you for taking time out of your busy schedule to complete your Annual Wellness Visit with me. I enjoyed our conversation and look forward to speaking with you again next year. I, as well as your care team,  appreciate your ongoing commitment to your health goals. Please review the following plan we discussed and let me know if I can assist you in the future. Your Game plan/ To Do List    Referrals: If you haven't heard from the office you've been referred to, please reach out to them at the phone provided.   Follow up Visits: We will see or speak with you next year for your Next Medicare AWV with our clinical staff Have you seen your provider in the last 6 months (3 months if uncontrolled diabetes)? Yes  Clinician Recommendations:  Aim for 30 minutes of exercise or brisk walking, 6-8 glasses of water, and 5 servings of fruits and vegetables each day.       This is a list of the screenings recommended for you:  Health Maintenance  Topic Date Due   COVID-19 Vaccine (4 - 2024-25 season) 10/31/2022   Flu Shot  09/30/2023   DTaP/Tdap/Td vaccine (2 - Tdap) 08/31/2024*   Medicare Annual Wellness Visit  10/04/2024   Mammogram  06/02/2025   Colon Cancer Screening  11/12/2027   Pneumococcal Vaccine for age over 3  Completed   DEXA scan (bone density measurement)  Completed   Hepatitis C Screening  Completed   Zoster (Shingles) Vaccine  Completed   Hepatitis B Vaccine  Aged Out   HPV Vaccine  Aged Out   Meningitis B Vaccine  Aged Out  *Topic was postponed. The date shown is not the original due date.    Advanced directives: (Declined) Advance directive discussed with you today. Even though you declined this today, please call our office should you change your mind, and we can give you the proper paperwork for you to fill out. Advance Care Planning is important because it:  [x]  Makes sure you receive the medical care that is consistent with your values, goals, and  preferences  [x]  It provides guidance to your family and loved ones and reduces their decisional burden about whether or not they are making the right decisions based on your wishes.  Follow the link provided in your after visit summary or read over the paperwork we have mailed to you to help you started getting your Advance Directives in place. If you need assistance in completing these, please reach out to us  so that we can help you!  See attachments for Preventive Care and Fall Prevention Tips.

## 2023-12-12 DIAGNOSIS — Z961 Presence of intraocular lens: Secondary | ICD-10-CM | POA: Diagnosis not present

## 2023-12-12 DIAGNOSIS — H26492 Other secondary cataract, left eye: Secondary | ICD-10-CM | POA: Diagnosis not present

## 2023-12-12 DIAGNOSIS — H40013 Open angle with borderline findings, low risk, bilateral: Secondary | ICD-10-CM | POA: Diagnosis not present

## 2023-12-12 DIAGNOSIS — H04123 Dry eye syndrome of bilateral lacrimal glands: Secondary | ICD-10-CM | POA: Diagnosis not present

## 2023-12-13 ENCOUNTER — Other Ambulatory Visit: Payer: Self-pay

## 2023-12-13 ENCOUNTER — Ambulatory Visit (INDEPENDENT_AMBULATORY_CARE_PROVIDER_SITE_OTHER)

## 2023-12-13 VITALS — BP 135/85 | HR 90 | Resp 14

## 2023-12-13 DIAGNOSIS — Z23 Encounter for immunization: Secondary | ICD-10-CM | POA: Diagnosis not present

## 2023-12-13 DIAGNOSIS — I10 Essential (primary) hypertension: Secondary | ICD-10-CM

## 2023-12-13 LAB — BMP8+EGFR
BUN/Creatinine Ratio: 13 (ref 12–28)
BUN: 12 mg/dL (ref 8–27)
CO2: 24 mmol/L (ref 20–29)
Calcium: 9.4 mg/dL (ref 8.7–10.3)
Chloride: 101 mmol/L (ref 96–106)
Creatinine, Ser: 0.9 mg/dL (ref 0.57–1.00)
Glucose: 108 mg/dL — ABNORMAL HIGH (ref 70–99)
Potassium: 3.8 mmol/L (ref 3.5–5.2)
Sodium: 139 mmol/L (ref 134–144)
eGFR: 70 mL/min/1.73 (ref >59)

## 2023-12-13 NOTE — Progress Notes (Cosign Needed Addendum)
 Fluzone high dose administered left deltoid  -pt tolerated injection well

## 2023-12-14 ENCOUNTER — Ambulatory Visit: Payer: Self-pay | Admitting: Internal Medicine

## 2024-02-14 ENCOUNTER — Ambulatory Visit (HOSPITAL_COMMUNITY)

## 2024-02-14 ENCOUNTER — Encounter (HOSPITAL_COMMUNITY): Payer: Self-pay

## 2024-02-14 ENCOUNTER — Inpatient Hospital Stay (HOSPITAL_COMMUNITY): Admission: RE | Admit: 2024-02-14 | Discharge: 2024-02-14

## 2024-02-14 VITALS — BP 144/75 | HR 79 | Temp 98.7°F | Resp 17

## 2024-02-14 DIAGNOSIS — R051 Acute cough: Secondary | ICD-10-CM | POA: Diagnosis not present

## 2024-02-14 DIAGNOSIS — R062 Wheezing: Secondary | ICD-10-CM

## 2024-02-14 MED ORDER — IPRATROPIUM-ALBUTEROL 0.5-2.5 (3) MG/3ML IN SOLN
3.0000 mL | Freq: Once | RESPIRATORY_TRACT | Status: AC
  Administered 2024-02-14: 10:00:00 3 mL via RESPIRATORY_TRACT

## 2024-02-14 MED ORDER — PREDNISONE 20 MG PO TABS: 40.0000 mg | ORAL_TABLET | Freq: Every day | ORAL | 0 refills | Status: AC

## 2024-02-14 MED ORDER — BENZONATATE 100 MG PO CAPS: 100.0000 mg | ORAL_CAPSULE | Freq: Three times a day (TID) | ORAL | 0 refills | Status: AC | PRN

## 2024-02-14 MED ORDER — ALBUTEROL SULFATE HFA 108 (90 BASE) MCG/ACT IN AERS: 1.0000 | INHALATION_SPRAY | Freq: Four times a day (QID) | RESPIRATORY_TRACT | 0 refills | Status: AC | PRN

## 2024-02-14 MED ORDER — IPRATROPIUM-ALBUTEROL 0.5-2.5 (3) MG/3ML IN SOLN
RESPIRATORY_TRACT | Status: AC
  Filled 2024-02-14: qty 3

## 2024-02-14 NOTE — ED Triage Notes (Addendum)
 Pt has c/o cough, shortness of breath, and wheezing since Saturday.  Has bronchitis, has taken inhaler on Saturday. Takes allergy medication. Denies chest pain

## 2024-02-14 NOTE — Discharge Instructions (Addendum)
 Bronchitis Your chest x-ray is negative.  You have been given a prescription for prednisone .  Take 2 tablets daily for 5 days. You have been given a prescription for benzonatate .  Take 1 capsule every 8 hours as needed for cough. You have been given a prescription for albuterol  inhaler.  Inhale 1-2 puffs every 6 hours as needed for shortness of breath or wheezing.  Tylenol, guaifenesin  (plain mucinex ), and saline nasal sprays may help relieve symptoms.   Two teaspoons of honey in 1 cup of warm water every 4-6 hours may help with throat pains.  Salt water gargle can help with sore throat.  Mix 1/2-1 teaspoon of salt in 8 ounces of warm water.  Gargle and spit several times a day.  Humidifier in room at nighttime may help soothe cough (clean well daily).   For chest pain, shortness of breath, inability to keep food or fluids down without vomiting, fever that does not respond to tylenol or motrin, or any other severe symptoms, please go to the ER for further evaluation. Return to urgent care as needed, otherwise follow-up with PCP.

## 2024-02-14 NOTE — ED Provider Notes (Signed)
 [facility]    CSN: [id number] Arrival date & time: 02/14/24  9074      History   Chief Complaint Chief Complaint  Patient presents with   Cough    HPI Jasmine Harrington is a 68 y.o. female.   This 68 year old female is being seen for complaints of acute onset congested cough, shortness of breath, wheezing since Saturday.  She has a history of bronchitis.  She has been using her albuterol  inhaler and taking allergy medication without significant relief of symptoms.  She has some rhinorrhea and postnasal drip.  She had chills on Saturday.  She denies fever, headache, dizziness, ear pain.  She denies chest pain.  She denies abdominal pain, nausea, vomiting, diarrhea.  She denies known sick contacts.   Cough Associated symptoms: chest pain, chills, shortness of breath, sore throat and wheezing   Associated symptoms: no ear pain, no fever, no headaches and no rash     Past Medical History:  Diagnosis Date   Arthritis    Hypertension    Pneumonia    Tuberculosis    pt was pos for tb . had to take 6 months of meds .     Patient Active Problem List   Diagnosis Date Noted   Encounter for annual physical exam 09/05/2023   Dense breast tissue on mammogram 09/05/2023   Menopausal symptoms 09/05/2023   Allergic rhinitis with postnasal drip 03/03/2023   Other abnormal glucose 08/31/2022   Pure hypercholesterolemia 08/31/2022   Estrogen deficiency 08/11/2020   Class 3 severe obesity due to excess calories with serious comorbidity and body mass index (BMI) of 50.0 to 59.9 in adult [facility]) 03/26/2019   Encounter for Medicare annual wellness exam 06/22/2017   Paresthesia 07/07/2016   Essential hypertension 12/14/2011    Past Surgical History:  Procedure Laterality Date   ABDOMINAL HYSTERECTOMY  2008   COLONOSCOPY  10/2017   KNEE SURGERY Right 1965    OB History     Gravida  2   Para  1   Term  1   Preterm      AB  1   Living  1      SAB  1   IAB       Ectopic      Multiple      Live Births               Home Medications    Prior to Admission medications  Medication Sig Start Date End Date Taking? Authorizing Provider  albuterol  (VENTOLIN  HFA) 108 (90 Base) MCG/ACT inhaler Inhale 1-2 puffs into the lungs every 6 (six) hours as needed for wheezing or shortness of breath. 02/14/24  Yes Gracin Mcpartland C, FNP  benzonatate  (TESSALON ) 100 MG capsule Take 1 capsule (100 mg total) by mouth every 8 (eight) hours as needed for cough. 02/14/24  Yes Tacari Repass C, FNP  predniSONE  (DELTASONE ) 20 MG tablet Take 2 tablets (40 mg total) by mouth daily with breakfast for 5 days. 02/14/24 02/19/24 Yes Levonte Molina C, FNP  amLODipine  (NORVASC ) 2.5 MG tablet Take 1 tablet (2.5 mg total) by mouth daily. 09/12/23 09/11/24  Jarold Medici, MD  atorvastatin  (LIPITOR) 10 MG tablet TAKE 1 TABLET ON MONDAY THROUGH FRIDAY, SKIP WEEKENDS 09/01/23   Jarold Medici, MD  azelastine  (ASTELIN ) 0.1 % nasal spray Place 1 spray into both nostrils 2 (two) times daily. Use in each nostril as directed 09/01/23   Jarold Medici, MD  cholecalciferol (VITAMIN D ) 1000  UNITS tablet Take 1,000 Units by mouth daily.     [provider]  fluticasone  (FLONASE ) 50 MCG/ACT nasal spray Place 1 spray into both nostrils in the morning and at bedtime for 7 days. 09/01/23 10/05/23  Jarold Medici, MD  hydrochlorothiazide  (MICROZIDE ) 12.5 MG capsule Take 1 capsule (12.5 mg total) by mouth daily. 09/01/23   Jarold Medici, MD  levocetirizine (XYZAL ) 5 MG tablet Take 1 tablet (5 mg total) by mouth every evening. 09/01/23   Jarold Medici, MD  montelukast  (SINGULAIR ) 10 MG tablet TAKE 1 TABLET(10 MG) BY MOUTH DAILY 09/12/23   Jarold Medici, MD  olmesartan  (BENICAR ) 40 MG tablet Take 1 tablet (40 mg total) by mouth daily. 09/01/23   Jarold Medici, MD  omega-3 acid ethyl esters (LOVAZA) 1 G capsule Take 1 g by mouth daily.    [provider]  promethazine -dextromethorphan (PROMETHAZINE -DM)  6.25-15 MG/5ML syrup Take 5 mLs by mouth every 8 (eight) hours as needed for cough. Patient not taking: Reported on 10/05/2023 04/18/23   Teresa Norris A, PA-C  sodium chloride (OCEAN) 0.65 % SOLN nasal spray Place 1 spray into both nostrils as needed for congestion. 10/26/19   Darr, Jacob, PA-C  VITAMIN E PO Take by mouth.    [provider]    Family History Family History  Problem Relation Age of Onset   Hypertension Paternal Grandfather    Hypertension Paternal Grandmother    Hypertension Maternal Grandmother    Hypertension Maternal Grandfather    Prostate cancer Father    Breast cancer Mother    Leukemia Brother     Social History Social History[1]   Allergies   Aspirin and Penicillins   Review of Systems Review of Systems  Constitutional:  Positive for chills. Negative for activity change, appetite change and fever.  HENT:  Positive for congestion, postnasal drip, sore throat and voice change. Negative for ear pain, sinus pressure, sinus pain, sneezing and trouble swallowing.   Respiratory:  Positive for cough, shortness of breath and wheezing.   Cardiovascular:  Positive for chest pain.  Gastrointestinal:  Negative for abdominal pain, diarrhea, nausea and vomiting.  Skin:  Negative for color change and rash.  Neurological:  Negative for dizziness and headaches.  All other systems reviewed and are negative.    Physical Exam Triage Vital Signs ED Triage Vitals  Encounter Vitals Group     BP 02/14/24 0939 (!) 144/75     Girls Systolic BP Percentile --      Girls Diastolic BP Percentile --      Boys Systolic BP Percentile --      Boys Diastolic BP Percentile --      Pulse Rate 02/14/24 0939 79     Resp 02/14/24 0939 17     Temp 02/14/24 0939 98.7 F (37.1 C)     Temp Source 02/14/24 0939 Oral     SpO2 02/14/24 0939 95 %     Weight --      Height --      Head Circumference --      Peak Flow --      Pain Score 02/14/24 0942 5     Pain Loc --       Pain Education --      Exclude from Growth Chart --    No data found.  Updated Vital Signs BP (!) 144/75 (BP Location: Right Arm)   Pulse 79   Temp 98.7 F (37.1 C) (Oral)   Resp 17  SpO2 95%   Visual Acuity Right Eye Distance:   Left Eye Distance:   Bilateral Distance:    Right Eye Near:   Left Eye Near:    Bilateral Near:     Physical Exam Vitals and nursing note reviewed.  Constitutional:      General: She is not in acute distress.    Appearance: She is well-developed. She is not ill-appearing or toxic-appearing.     Comments: Pleasant female appearing stated age found sitting in chair in no acute distress.  HENT:     Head: Normocephalic and atraumatic.     Right Ear: External ear normal.     Left Ear: External ear normal.     Nose: Congestion and rhinorrhea present.     Right Turbinates: Enlarged and pale.     Left Turbinates: Enlarged.     Mouth/Throat:     Lips: Pink.     Mouth: Mucous membranes are moist.     Pharynx: No oropharyngeal exudate or posterior oropharyngeal erythema.  Eyes:     Conjunctiva/sclera: Conjunctivae normal.  Cardiovascular:     Rate and Rhythm: Normal rate and regular rhythm.     Heart sounds: Normal heart sounds. No murmur heard. Pulmonary:     Effort: Pulmonary effort is normal. No tachypnea, bradypnea or respiratory distress.     Breath sounds: Wheezing present.  Abdominal:     General: Bowel sounds are normal.     Palpations: Abdomen is soft.     Tenderness: There is no abdominal tenderness.  Skin:    General: Skin is warm and dry.  Neurological:     Mental Status: She is alert.  Psychiatric:        Mood and Affect: Mood normal.        Behavior: Behavior is cooperative.      UC Treatments / Results  Labs (all labs ordered are listed, but only abnormal results are displayed) Labs Reviewed - No data to display  EKG   Radiology DG Chest 2 View Result Date: 02/14/2024 CLINICAL DATA:  Cough EXAM: CHEST - 2 VIEW  COMPARISON:  September 08, 2011 FINDINGS: The heart size and mediastinal contours are within normal limits. Both lungs are clear. The visualized skeletal structures are unremarkable. IMPRESSION: No active cardiopulmonary disease. Electronically Signed   By: Lynwood Landy Raddle M.D.   On: 02/14/2024 11:00    Procedures Procedures (including critical care time)  Medications Ordered in UC Medications  ipratropium-albuterol  (DUONEB) 0.5-2.5 (3) MG/3ML nebulizer solution 3 mL (3 mLs Nebulization Given 02/14/24 1012)    Initial Impression / Assessment and Plan / UC Course  I have reviewed the triage vital signs and the nursing notes.  Pertinent labs & imaging results that were available during my care of the patient were reviewed by me and considered in my medical decision making (see chart for details).     Vitals in triage reviewed, patient is hemodynamically stable.  Suspect bronchitis.  Chest x-ray is negative for acute findings.  She received DuoNeb in clinic with improvement of wheezing.  She is advised to continue with albuterol  inhaler.  She is prescribed short course of steroids, benzonatate , albuterol  inhaler.  Plan of care, follow-up care, return precautions given, no questions at this time. Final Clinical Impressions(s) / UC Diagnoses   Final diagnoses:  Acute cough  Wheezing     Discharge Instructions      Bronchitis Your chest x-ray is negative.  You have been given a prescription for prednisone .  Take 2 tablets daily for 5 days. You have been given a prescription for benzonatate .  Take 1 capsule every 8 hours as needed for cough. You have been given a prescription for albuterol  inhaler.  Inhale 1-2 puffs every 6 hours as needed for shortness of breath or wheezing.  Tylenol, guaifenesin  (plain mucinex ), and saline nasal sprays may help relieve symptoms.   Two teaspoons of honey in 1 cup of warm water every 4-6 hours may help with throat pains.  Salt water gargle can help with  sore throat.  Mix 1/2-1 teaspoon of salt in 8 ounces of warm water.  Gargle and spit several times a day.  Humidifier in room at nighttime may help soothe cough (clean well daily).   For chest pain, shortness of breath, inability to keep food or fluids down without vomiting, fever that does not respond to tylenol or motrin, or any other severe symptoms, please go to the ER for further evaluation. Return to urgent care as needed, otherwise follow-up with PCP.       ED Prescriptions     Medication Sig Dispense Auth. Provider   predniSONE  (DELTASONE ) 20 MG tablet Take 2 tablets (40 mg total) by mouth daily with breakfast for 5 days. 10 tablet Gaspard Isbell C, FNP   benzonatate  (TESSALON ) 100 MG capsule Take 1 capsule (100 mg total) by mouth every 8 (eight) hours as needed for cough. 21 capsule Hollister Wessler C, FNP   albuterol  (VENTOLIN  HFA) 108 (90 Base) MCG/ACT inhaler Inhale 1-2 puffs into the lungs every 6 (six) hours as needed for wheezing or shortness of breath. 1 each Ruby Logiudice C, FNP      PDMP not reviewed this encounter.    [1]  Social History Tobacco Use   Smoking status: Never   Smokeless tobacco: Never  Vaping Use   Vaping status: Never Used  Substance Use Topics   Alcohol use: No   Drug use: No     Lennice Jon BROCKS, FNP 02/14/24 1124

## 2024-03-06 ENCOUNTER — Encounter: Payer: Self-pay | Admitting: Internal Medicine

## 2024-03-06 ENCOUNTER — Ambulatory Visit (INDEPENDENT_AMBULATORY_CARE_PROVIDER_SITE_OTHER): Payer: Self-pay | Admitting: Internal Medicine

## 2024-03-06 ENCOUNTER — Other Ambulatory Visit: Payer: Self-pay

## 2024-03-06 VITALS — BP 124/80 | HR 91 | Temp 98.3°F | Ht 60.0 in | Wt 273.4 lb

## 2024-03-06 DIAGNOSIS — E7800 Pure hypercholesterolemia, unspecified: Secondary | ICD-10-CM

## 2024-03-06 DIAGNOSIS — E2839 Other primary ovarian failure: Secondary | ICD-10-CM | POA: Diagnosis not present

## 2024-03-06 DIAGNOSIS — Z6841 Body Mass Index (BMI) 40.0 and over, adult: Secondary | ICD-10-CM | POA: Diagnosis not present

## 2024-03-06 DIAGNOSIS — I10 Essential (primary) hypertension: Secondary | ICD-10-CM | POA: Diagnosis not present

## 2024-03-06 DIAGNOSIS — R7309 Other abnormal glucose: Secondary | ICD-10-CM

## 2024-03-06 DIAGNOSIS — F40240 Claustrophobia: Secondary | ICD-10-CM | POA: Diagnosis not present

## 2024-03-06 LAB — CMP14+EGFR
ALT: 70 IU/L — ABNORMAL HIGH (ref 0–32)
AST: 45 IU/L — ABNORMAL HIGH (ref 0–40)
Albumin: 4.1 g/dL (ref 3.9–4.9)
Alkaline Phosphatase: 95 IU/L (ref 49–135)
BUN/Creatinine Ratio: 16 (ref 12–28)
BUN: 14 mg/dL (ref 8–27)
Bilirubin Total: 0.5 mg/dL (ref 0.0–1.2)
CO2: 24 mmol/L (ref 20–29)
Calcium: 9.5 mg/dL (ref 8.7–10.3)
Chloride: 102 mmol/L (ref 96–106)
Creatinine, Ser: 0.88 mg/dL (ref 0.57–1.00)
Globulin, Total: 3 g/dL (ref 1.5–4.5)
Glucose: 108 mg/dL — ABNORMAL HIGH (ref 70–99)
Potassium: 4.1 mmol/L (ref 3.5–5.2)
Sodium: 141 mmol/L (ref 134–144)
Total Protein: 7.1 g/dL (ref 6.0–8.5)
eGFR: 72 mL/min/1.73

## 2024-03-06 LAB — HEMOGLOBIN A1C
Est. average glucose Bld gHb Est-mCnc: 123 mg/dL
Hgb A1c MFr Bld: 5.9 % — ABNORMAL HIGH (ref 4.8–5.6)

## 2024-03-06 LAB — LIPID PANEL
Chol/HDL Ratio: 2.6 ratio (ref 0.0–4.4)
Cholesterol, Total: 127 mg/dL (ref 100–199)
HDL: 48 mg/dL
LDL Chol Calc (NIH): 64 mg/dL (ref 0–99)
Triglycerides: 75 mg/dL (ref 0–149)
VLDL Cholesterol Cal: 15 mg/dL (ref 5–40)

## 2024-03-06 MED ORDER — DIAZEPAM 2 MG PO TABS: ORAL_TABLET | ORAL | 0 refills | Status: DC

## 2024-03-06 MED ORDER — LEVOCETIRIZINE DIHYDROCHLORIDE 5 MG PO TABS: 5.0000 mg | ORAL_TABLET | Freq: Every evening | ORAL | 1 refills | Status: AC

## 2024-03-06 MED ORDER — AMLODIPINE BESYLATE 2.5 MG PO TABS: 2.5000 mg | ORAL_TABLET | Freq: Every day | ORAL | 2 refills | Status: AC

## 2024-03-06 MED ORDER — HYDROCHLOROTHIAZIDE 12.5 MG PO CAPS: 12.5000 mg | ORAL_CAPSULE | Freq: Every day | ORAL | 1 refills | Status: AC

## 2024-03-06 NOTE — Assessment & Plan Note (Addendum)
 On atorvastatin . Previous cholesterol levels checked in January. Encouraged lifestyle modifications. - Continue atorvastatin  as prescribed. - Encourage regular exercise for cholesterol management. - She agrees to cardiac calcium  scoring, she is aware of $99 cost.

## 2024-03-06 NOTE — Assessment & Plan Note (Addendum)
 Previous labs reviewed, her A1c has been elevated in the past. I will check an A1c today. Reminded to avoid refined sugars including sugary drinks/foods and processed meats including bacon, sausages and deli meats.

## 2024-03-06 NOTE — Progress Notes (Signed)
 I,[name], CMA,acting as a neurosurgeon for Jasmine LOISE Slocumb, MD.,have documented all relevant documentation on the behalf of Jasmine LOISE Slocumb, MD,as directed by  Jasmine LOISE Slocumb, MD while in the presence of Jasmine LOISE Slocumb, MD.  Subjective:  Patient ID: Jasmine Harrington , female    DOB: [birth year] , 69 y.o.   MRN: [id number]  Chief Complaint  Patient presents with   Hypertension    She presents today for BP & chol check. She reports compliance with meds.  She denies headaches, chest pain and shortness of breath.  She reports once before going to Urgent care for cough & being prescribed promethazine . She admits this is the only thing that helps her cough at night for a good nights rest.    Hyperlipidemia    HPI Discussed the use of AI scribe software for clinical note transcription with the patient, who gave verbal consent to proceed.  History of Present Illness Jasmine Harrington is a 69 year old female with hypertension who presents for a blood pressure check.  She is taking atorvastatin  10 mg, hydrochlorothiazide  12.5 mg, olmesartan , and amlodipine  2.5 mg for blood pressure and cholesterol management. She also uses Xyzal  and Singulair  for allergies. No chest pain, shortness of breath, or palpitations.  She has a BMI of 53 and has gained four pounds since July. She is not engaging in much exercise currently due to the cold weather, although she has been more active since visiting home after Christmas, running with her husband.  She experiences significant claustrophobia, which affects her ability to undergo certain medical tests such as bone density scans. She recalls previous experiences where she could not lay flat due to this fear and describes needing to be in a corner when in crowded elevators to avoid hyperventilating.  Her family history includes both parents having congestive heart disease, and her mother had a heart attack.  She has received her flu and pneumonia vaccines last year  but did not get the COVID vaccine. She believes she received the RSV vaccine at [facility].   Hypertension This is a chronic problem. The current episode started more than 1 year ago. The problem has been gradually improving since onset. The problem is controlled. Pertinent negatives include no blurred vision. Risk factors for coronary artery disease include dyslipidemia, obesity and sedentary lifestyle. Past treatments include angiotensin blockers and diuretics. The current treatment provides moderate improvement. Compliance problems include exercise.  Hypertensive end-organ damage includes kidney disease.     Past Medical History:  Diagnosis Date   Arthritis    Hypertension    Pneumonia    Tuberculosis    pt was pos for tb . had to take 6 months of meds .      Family History  Problem Relation Age of Onset   Hypertension Paternal Grandfather    Hypertension Paternal Grandmother    Hypertension Maternal Grandmother    Hypertension Maternal Grandfather    Prostate cancer Father    Breast cancer Mother    Leukemia Brother      Current Outpatient Medications:    albuterol  (VENTOLIN  HFA) 108 (90 Base) MCG/ACT inhaler, Inhale 1-2 puffs into the lungs every 6 (six) hours as needed for wheezing or shortness of breath., Disp: 1 each, Rfl: 0   atorvastatin  (LIPITOR) 10 MG tablet, TAKE 1 TABLET ON MONDAY THROUGH FRIDAY, SKIP WEEKENDS, Disp: 90 tablet, Rfl: 3   azelastine  (ASTELIN ) 0.1 % nasal spray, Place 1 spray into both nostrils 2 (two)  times daily. Use in each nostril as directed, Disp: 90 mL, Rfl: 3   benzonatate  (TESSALON ) 100 MG capsule, Take 1 capsule (100 mg total) by mouth every 8 (eight) hours as needed for cough., Disp: 21 capsule, Rfl: 0   cholecalciferol (VITAMIN D ) 1000 UNITS tablet, Take 1,000 Units by mouth daily. , Disp: , Rfl:    fluticasone  (FLONASE ) 50 MCG/ACT nasal spray, Place 1 spray into both nostrils in the morning and at bedtime for 7 days., Disp: 1 g, Rfl: 0    montelukast  (SINGULAIR ) 10 MG tablet, TAKE 1 TABLET(10 MG) BY MOUTH DAILY, Disp: 90 tablet, Rfl: 0   olmesartan  (BENICAR ) 40 MG tablet, Take 1 tablet (40 mg total) by mouth daily., Disp: 90 tablet, Rfl: 2   omega-3 acid ethyl esters (LOVAZA) 1 G capsule, Take 1 g by mouth daily., Disp: , Rfl:    sodium chloride (OCEAN) 0.65 % SOLN nasal spray, Place 1 spray into both nostrils as needed for congestion., Disp: 60 mL, Rfl: 0   VITAMIN E PO, Take by mouth., Disp: , Rfl:    amLODipine  (NORVASC ) 2.5 MG tablet, Take 1 tablet (2.5 mg total) by mouth daily., Disp: 90 tablet, Rfl: 2   diazepam  (VALIUM ) 2 MG tablet, One tab po 1 hr prior to procedure (CT scan), may repeat if needed, Disp: 2 tablet, Rfl: 0   hydrochlorothiazide  (MICROZIDE ) 12.5 MG capsule, Take 1 capsule (12.5 mg total) by mouth daily., Disp: 90 capsule, Rfl: 1   levocetirizine (XYZAL ) 5 MG tablet, Take 1 tablet (5 mg total) by mouth every evening., Disp: 90 tablet, Rfl: 1   promethazine -dextromethorphan (PROMETHAZINE -DM) 6.25-15 MG/5ML syrup, Take 5 mLs by mouth every 8 (eight) hours as needed for cough. (Patient not taking: Reported on 03/06/2024), Disp: 180 mL, Rfl: 0   Allergies  Allergen Reactions   Aspirin     rash   Penicillins Rash     Review of Systems  Constitutional: Negative.   Eyes:  Negative for blurred vision.  Respiratory: Negative.    Cardiovascular: Negative.   Gastrointestinal: Negative.   Neurological: Negative.   Psychiatric/Behavioral: Negative.       Today's Vitals   03/06/24 1025  BP: 124/80  Pulse: 91  Temp: 98.3 F (36.8 C)  SpO2: 98%  Weight: 273 lb 6.4 oz (124 kg)  Height: 5' (1.524 m)   Body mass index is 53.39 kg/m.  Wt Readings from Last 3 Encounters:  03/06/24 273 lb 6.4 oz (124 kg)  10/05/23 274 lb (124.3 kg)  09/01/23 269 lb 9.6 oz (122.3 kg)    The ASCVD Risk score (Arnett DK, et al., 2019) failed to calculate for the following reasons:   The valid total cholesterol range is 130 to  320 mg/dL  Objective:  Physical Exam Vitals and nursing note reviewed.  Constitutional:      Appearance: Normal appearance. She is obese.  HENT:     Head: Normocephalic and atraumatic.  Eyes:     Extraocular Movements: Extraocular movements intact.  Cardiovascular:     Rate and Rhythm: Normal rate and regular rhythm.     Heart sounds: Normal heart sounds.  Pulmonary:     Effort: Pulmonary effort is normal.     Breath sounds: Normal breath sounds.  Musculoskeletal:     Cervical back: Normal range of motion.  Skin:    General: Skin is warm.  Neurological:     General: No focal deficit present.     Mental Status: She is alert.  Psychiatric:        Mood and Affect: Mood normal.        Behavior: Behavior normal.       Assessment And Plan:   Assessment & Plan Essential hypertension Chronic, controlled.  Blood pressure at 124/80 mmHg, near target of <130/80 mmHg. - Continue olmesartan  and amlodipine . - Follow low sodium diet.  - Follow up in six months for physical exam. Pure hypercholesterolemia On atorvastatin . Previous cholesterol levels checked in January. Encouraged lifestyle modifications. - Continue atorvastatin  as prescribed. - Encourage regular exercise for cholesterol management. - She agrees to cardiac calcium  scoring, she is aware of $99 cost.  Decreased estrogen level I will refer her to [facility] for bone density. Encouraged to engage in weight-bearing exercises at least 3 days weekly.  Other abnormal glucose Previous labs reviewed, her A1c has been elevated in the past. I will check an A1c today. Reminded to avoid refined sugars including sugary drinks/foods and processed meats including bacon, sausages and deli meats.   Morbid obesity with BMI of 50.0-59.9, adult (HCC) BMI 53.  She is encouraged to strive to lose ten percent of her body weight to decrease cardiac risk.  Claustrophobia Claustrophobia affects ability to undergo certain medical procedures. -  Ordered bone density test with instructions for partial scanning if needed. - Provided Valium  for cardiac calcium  score test to manage anxiety. General health maintenance Mammogram scheduled for March. Physical exam scheduled for July. Discussed importance of vaccinations. - Ensure mammogram in March. - Ensure physical exam in July. - Verify vaccination status, including RSV and COVID-2023.  Orders Placed This Encounter  Procedures   CT CARDIAC SCORING (SELF PAY ONLY)   CMP14+EGFR   Lipid panel   Hemoglobin A1c   Return if symptoms worsen or fail to improve.  Patient was given opportunity to ask questions. Patient verbalized understanding of the plan and was able to repeat key elements of the plan. All questions were answered to their satisfaction.   I, Jasmine LOISE Slocumb, MD, have reviewed all documentation for this visit. The documentation on 03/06/2024 for the exam, diagnosis, procedures, and orders are all accurate and complete.   IF YOU HAVE BEEN REFERRED TO A SPECIALIST, IT MAY TAKE 1-2 WEEKS TO SCHEDULE/PROCESS THE REFERRAL. IF YOU HAVE NOT HEARD FROM US /SPECIALIST IN TWO WEEKS, PLEASE GIVE US  A CALL AT [phone].

## 2024-03-06 NOTE — Patient Instructions (Signed)
 Hypertension, Adult Hypertension is another name for high blood pressure. High blood pressure forces your heart to work harder to pump blood. This can cause problems over time. There are two numbers in a blood pressure reading. There is a top number (systolic) over a bottom number (diastolic). It is best to have a blood pressure that is below 120/80. What are the causes? The cause of this condition is not known. Some other conditions can lead to high blood pressure. What increases the risk? Some lifestyle factors can make you more likely to develop high blood pressure: Smoking. Not getting enough exercise or physical activity. Being overweight. Having too much fat, sugar, calories, or salt (sodium) in your diet. Drinking too much alcohol. Other risk factors include: Having any of these conditions: Heart disease. Diabetes. High cholesterol. Kidney disease. Obstructive sleep apnea. Having a family history of high blood pressure and high cholesterol. Age. The risk increases with age. Stress. What are the signs or symptoms? High blood pressure may not cause symptoms. Very high blood pressure (hypertensive crisis) may cause: Headache. Fast or uneven heartbeats (palpitations). Shortness of breath. Nosebleed. Vomiting or feeling like you may vomit (nauseous). Changes in how you see. Very bad chest pain. Feeling dizzy. Seizures. How is this treated? This condition is treated by making healthy lifestyle changes, such as: Eating healthy foods. Exercising more. Drinking less alcohol. Your doctor may prescribe medicine if lifestyle changes do not help enough and if: Your top number is above 130. Your bottom number is above 80. Your personal target blood pressure may vary. Follow these instructions at home: Eating and drinking  If told, follow the DASH eating plan. To follow this plan: Fill one half of your plate at each meal with fruits and vegetables. Fill one fourth of your plate  at each meal with whole grains. Whole grains include whole-wheat pasta, brown rice, and whole-grain bread. Eat or drink low-fat dairy products, such as skim milk or low-fat yogurt. Fill one fourth of your plate at each meal with low-fat (lean) proteins. Low-fat proteins include fish, chicken without skin, eggs, beans, and tofu. Avoid fatty meat, cured and processed meat, or chicken with skin. Avoid pre-made or processed food. Limit the amount of salt in your diet to less than 1,500 mg each day. Do not drink alcohol if: Your doctor tells you not to drink. You are pregnant, may be pregnant, or are planning to become pregnant. If you drink alcohol: Limit how much you have to: 0-1 drink a day for women. 0-2 drinks a day for men. Know how much alcohol is in your drink. In the [place] one drink equals one 12 oz bottle of beer (355 mL), one 5 oz glass of wine (148 mL), or one 1 oz glass of hard liquor (44 mL). Lifestyle  Work with your doctor to stay at a healthy weight or to lose weight. Ask your doctor what the best weight is for you. Get at least 30 minutes of exercise that causes your heart to beat faster (aerobic exercise) most days of the week. This may include walking, swimming, or biking. Get at least 30 minutes of exercise that strengthens your muscles (resistance exercise) at least 3 days a week. This may include lifting weights or doing Pilates. Do not smoke or use any products that contain nicotine or tobacco. If you need help quitting, ask your doctor. Check your blood pressure at home as told by your doctor. Keep all follow-up visits. Medicines Take over-the-counter and prescription medicines  only as told by your doctor. Follow directions carefully. Do not skip doses of blood pressure medicine. The medicine does not work as well if you skip doses. Skipping doses also puts you at risk for problems. Ask your doctor about side effects or reactions to medicines that you should watch  for. Contact a doctor if: You think you are having a reaction to the medicine you are taking. You have headaches that keep coming back. You feel dizzy. You have swelling in your ankles. You have trouble with your vision. Get help right away if: You get a very bad headache. You start to feel mixed up (confused). You feel weak or numb. You feel faint. You have very bad pain in your: Chest. Belly (abdomen). You vomit more than once. You have trouble breathing. These symptoms may be an emergency. Get help right away. Call 911. Do not wait to see if the symptoms will go away. Do not drive yourself to the hospital. Summary Hypertension is another name for high blood pressure. High blood pressure forces your heart to work harder to pump blood. For most people, a normal blood pressure is less than 120/80. Making healthy choices can help lower blood pressure. If your blood pressure does not get lower with healthy choices, you may need to take medicine. This information is not intended to replace advice given to you by your health care provider. Make sure you discuss any questions you have with your health care provider. Document Revised: 12/04/2020 Document Reviewed: 12/04/2020 Elsevier Patient Education  2024 [employer].

## 2024-03-06 NOTE — Assessment & Plan Note (Addendum)
 Chronic, controlled.  Blood pressure at 124/80 mmHg, near target of <130/80 mmHg. - Continue olmesartan  and amlodipine . - Follow low sodium diet.  - Follow up in six months for physical exam.

## 2024-03-08 ENCOUNTER — Other Ambulatory Visit: Payer: Self-pay | Admitting: Internal Medicine

## 2024-03-08 MED ORDER — DIAZEPAM 2 MG PO TABS: ORAL_TABLET | ORAL | 0 refills | Status: DC

## 2024-03-09 ENCOUNTER — Other Ambulatory Visit: Payer: Self-pay

## 2024-03-09 MED ORDER — DIAZEPAM 2 MG PO TABS: ORAL_TABLET | ORAL | 0 refills | Status: AC

## 2024-03-14 ENCOUNTER — Ambulatory Visit: Payer: Self-pay | Admitting: Internal Medicine

## 2024-04-06 ENCOUNTER — Encounter (HOSPITAL_BASED_OUTPATIENT_CLINIC_OR_DEPARTMENT_OTHER): Payer: Self-pay

## 2024-04-06 ENCOUNTER — Ambulatory Visit (HOSPITAL_BASED_OUTPATIENT_CLINIC_OR_DEPARTMENT_OTHER): Admission: RE | Admit: 2024-04-06 | Payer: Self-pay | Source: Ambulatory Visit

## 2024-04-06 DIAGNOSIS — E7800 Pure hypercholesterolemia, unspecified: Secondary | ICD-10-CM

## 2024-09-06 ENCOUNTER — Encounter: Payer: Self-pay | Admitting: Internal Medicine

## 2024-10-24 ENCOUNTER — Ambulatory Visit: Payer: Self-pay
